# Patient Record
Sex: Male | Born: 1960 | Race: White | Hispanic: No | Smoking: Former smoker
Health system: Southern US, Community
[De-identification: ages and names within clinical notes are randomized; demographics above are authoritative.]

## PROBLEM LIST (undated history)

## (undated) DIAGNOSIS — J449 Chronic obstructive pulmonary disease, unspecified: Secondary | ICD-10-CM

## (undated) DIAGNOSIS — R569 Unspecified convulsions: Secondary | ICD-10-CM

## (undated) DIAGNOSIS — J439 Emphysema, unspecified: Secondary | ICD-10-CM

## (undated) HISTORY — DX: Unspecified convulsions: R56.9

## (undated) HISTORY — DX: Chronic obstructive pulmonary disease, unspecified: J44.9

## (undated) HISTORY — DX: Emphysema, unspecified: J43.9

---

## 1997-08-04 ENCOUNTER — Emergency Department (HOSPITAL_COMMUNITY): Admission: EM | Admit: 1997-08-04 | Discharge: 1997-08-04 | Payer: Self-pay | Admitting: Emergency Medicine

## 1997-08-20 ENCOUNTER — Encounter: Admission: RE | Admit: 1997-08-20 | Discharge: 1997-08-20 | Payer: Self-pay | Admitting: Family Medicine

## 2007-06-15 ENCOUNTER — Telehealth: Payer: Self-pay | Admitting: *Deleted

## 2007-06-21 ENCOUNTER — Emergency Department (HOSPITAL_COMMUNITY): Admission: EM | Admit: 2007-06-21 | Discharge: 2007-06-21 | Payer: Self-pay | Admitting: Family Medicine

## 2007-06-27 ENCOUNTER — Ambulatory Visit: Payer: Self-pay | Admitting: Family Medicine

## 2007-06-27 DIAGNOSIS — M4722 Other spondylosis with radiculopathy, cervical region: Secondary | ICD-10-CM | POA: Insufficient documentation

## 2007-06-28 ENCOUNTER — Encounter: Payer: Self-pay | Admitting: Family Medicine

## 2008-11-17 ENCOUNTER — Encounter (INDEPENDENT_AMBULATORY_CARE_PROVIDER_SITE_OTHER): Payer: Self-pay | Admitting: *Deleted

## 2008-11-17 DIAGNOSIS — F17200 Nicotine dependence, unspecified, uncomplicated: Secondary | ICD-10-CM | POA: Insufficient documentation

## 2008-11-17 DIAGNOSIS — Z87891 Personal history of nicotine dependence: Secondary | ICD-10-CM | POA: Insufficient documentation

## 2010-04-28 ENCOUNTER — Encounter: Payer: Self-pay | Admitting: *Deleted

## 2010-08-19 ENCOUNTER — Inpatient Hospital Stay (INDEPENDENT_AMBULATORY_CARE_PROVIDER_SITE_OTHER)
Admission: RE | Admit: 2010-08-19 | Discharge: 2010-08-19 | Disposition: A | Discharge status: Home / Self Care | Payer: Managed Care, Other (non HMO) | Source: Ambulatory Visit | Attending: Emergency Medicine | Admitting: Emergency Medicine

## 2010-08-19 ENCOUNTER — Ambulatory Visit (INDEPENDENT_AMBULATORY_CARE_PROVIDER_SITE_OTHER): Payer: Managed Care, Other (non HMO)

## 2010-08-19 DIAGNOSIS — F17200 Nicotine dependence, unspecified, uncomplicated: Secondary | ICD-10-CM

## 2010-08-19 DIAGNOSIS — J40 Bronchitis, not specified as acute or chronic: Secondary | ICD-10-CM

## 2010-09-12 ENCOUNTER — Encounter: Payer: Self-pay | Admitting: Family Medicine

## 2010-09-12 ENCOUNTER — Ambulatory Visit (INDEPENDENT_AMBULATORY_CARE_PROVIDER_SITE_OTHER): Payer: 59 | Admitting: Family Medicine

## 2010-09-12 VITALS — BP 123/82 | HR 74 | Temp 98.3°F | Ht 67.0 in | Wt 139.2 lb

## 2010-09-12 DIAGNOSIS — F17200 Nicotine dependence, unspecified, uncomplicated: Secondary | ICD-10-CM

## 2010-09-12 DIAGNOSIS — Z23 Encounter for immunization: Secondary | ICD-10-CM

## 2010-09-12 DIAGNOSIS — J449 Chronic obstructive pulmonary disease, unspecified: Secondary | ICD-10-CM

## 2010-09-12 MED ORDER — VARENICLINE TARTRATE 0.5 MG PO TABS: 0.5000 mg | ORAL_TABLET | Freq: Two times a day (BID) | ORAL | Status: DC

## 2010-09-12 MED ORDER — VARENICLINE TARTRATE 1 MG PO TABS: 1.0000 mg | ORAL_TABLET | Freq: Two times a day (BID) | ORAL | Status: AC

## 2010-09-12 MED ORDER — TETANUS-DIPHTH-ACELL PERTUSSIS 5-2.5-18.5 LF-MCG/0.5 IM SUSP: 0.5000 mL | Freq: Once | INTRAMUSCULAR | Status: DC

## 2010-09-12 NOTE — Assessment & Plan Note (Signed)
Wants to quit.  Has tried effexor and nicotine replacement in past.  Would like to try Chantix.

## 2010-09-12 NOTE — Progress Notes (Signed)
  Subjective:    Patient ID: Marcus Pennington, male    DOB: [birth year], 50 y.o.   MRN: [id number]  HPI Previously seen here but it has been several years. Still with neck problems, now affecting Lt side Still smokes. Recently seen in ER with "bronchitis" and told possible emphysema    Review of Systems     Objective:   Physical Exam HEENT nl   Neck without masses Lungs, clear Cardiac RRR without m Abd benign Neuro nl strength in upper ext.   Assessment & Plan:

## 2010-09-12 NOTE — Assessment & Plan Note (Signed)
Per recent cxr.  Will refer to pharm clinic for PFTs.  Likely needs controler.

## 2010-09-12 NOTE — Patient Instructions (Signed)
Your first prescription for chantix is already at the pharmacy. The printed prescription is for refills Good luck quitting smoking See Dr. Raymondo Band in 1-2 weeks to see how the quitting is qoing and to do lung volumes.

## 2010-09-26 ENCOUNTER — Ambulatory Visit (INDEPENDENT_AMBULATORY_CARE_PROVIDER_SITE_OTHER): Payer: 59 | Admitting: Pharmacist

## 2010-09-26 ENCOUNTER — Encounter: Payer: Self-pay | Admitting: Pharmacist

## 2010-09-26 VITALS — BP 127/72 | HR 50 | Ht 66.0 in | Wt 142.5 lb

## 2010-09-26 DIAGNOSIS — F17200 Nicotine dependence, unspecified, uncomplicated: Secondary | ICD-10-CM

## 2010-09-26 NOTE — Assessment & Plan Note (Addendum)
Plan to quit July 30. Wants to cut down to 5 cigarettes by July 23.   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    Initiated varenicline tx 12 days ago. Patient counseled on purpose, proper use, and potential adverse effects, including GI upset, and potential change in mood. F/U phone call on Monday, July 30 at [phone].  F/U Rx Clinic Visit on a Monday, August 13 at 1:30.   Total time with patient in face-to-face counseling 35 minutes.  Patient seen with Dr. Mikel Cella.

## 2010-09-26 NOTE — Progress Notes (Signed)
  Subjective:    Patient ID: Marcus Pennington, male    DOB: Nov 18, 1960, 50 y.o.   MRN: [id number]  HPI  Wants to quit because it is hard to breathe when hot outside  Age when started using tobacco on a daily basis 16. Number of Cigarettes per day 40. Brand smoked Marlboro. Estimated Nicotine Content per Cigarette (mg) 1.  Estimated Nicotine intake per day 40 mg.  With recent decrease, now intake 10mg  per day Smokes first cigarette 30 minutes after waking. Denies waking to smoke now / Was waking up to smoke 2-3 times per night.    Estimated Fagerstrom Score >6/10.  Most recent quit attempt  4-5 years ago Longest time ever been tobacco free 3 days. What Medications (NRT, bupropion, varenicline) used in past includes bupropion. (Stopped due to insomnia and cravings)  Rates IMPORTANCE of quitting tobacco on 1-10 scale of 10. ("Don't like that I can't breathe") Rates READINESS of quitting tobacco on 1-10 scale of 10. Rates CONFIDENCE of quitting tobacco on 1-10 scale of 8. (Wants to and "pretty sure I can") Triggers to use tobacco include; stress, after meals, waking up in morning, going to bed, get in the car Most difficult cigarette to give up is the first one in the morning Number of stress cigarettes smoked in a week unknown (family members fighting in the house is most stressful situation for him)   On day 12 of Chantix now. Makes him itch some on the left side of his neck. Down to 1/2 ppd from 2 packs. Cravings much less between cigarettes. Does not take with food, but does not have nausea. Able to afford Chantix with no out of pocket expense. Daughter also smokes at home, she smokes [employer] and he feels that she will be supportive of his cessation.   Goal date to quit is Monday, July 30th.     Review of Systems     Objective:   Physical Exam        Assessment & Plan:

## 2010-09-26 NOTE — Progress Notes (Signed)
  Subjective:    Patient ID: Marcus Pennington, male    DOB: 1960-06-01, 50 y.o.   MRN: [id number]  HPI Reviewed and agree with Dr. Macky Lower management.    Review of Systems     Objective:   Physical Exam        Assessment & Plan:

## 2010-09-26 NOTE — Patient Instructions (Addendum)
It was nice to meet you today!  You are doing well on Chantix. Continue the doses Dr. Leveda Anna gave you with a quit date of July 30.   You can use hydrocortisone cream on the itch, but if it gets worse please let us know. If the itching persists after you quit, we may consider lowering your dose.  I will call on Monday, July 30 to follow-up with you. Please come back to see me in August for a brief visit to see how well you are doing and we will consider doing a lung test at that time.

## 2010-10-06 ENCOUNTER — Telehealth: Payer: Self-pay | Admitting: Pharmacist

## 2010-10-06 DIAGNOSIS — F17200 Nicotine dependence, unspecified, uncomplicated: Secondary | ICD-10-CM

## 2010-10-06 NOTE — Assessment & Plan Note (Signed)
Patient contacted. He denies smoking today!   He did well with tapering down over the last week or so.  He is trying to keep himself busy.   Encouraged continued abstinence.  Encouraged to keep appt in two weeks.    Itching on neck has remained the same.  Patient willing to continue with BID chantix.  Denies any other side effects.   Mood sounded like he was positive and determined.

## 2010-10-06 NOTE — Telephone Encounter (Signed)
Patient contacted. He denies smoking today!   He did well with tapering down over the last week or so.  He is trying to keep himself busy.   Encouraged continued abstinence.  Encouraged to keep appt in two weeks.    Itching on neck has remained the same.

## 2010-10-20 ENCOUNTER — Ambulatory Visit (INDEPENDENT_AMBULATORY_CARE_PROVIDER_SITE_OTHER): Payer: 59 | Admitting: Pharmacist

## 2010-10-20 ENCOUNTER — Encounter: Payer: Self-pay | Admitting: Pharmacist

## 2010-10-20 VITALS — BP 118/72 | HR 74 | Ht 67.0 in | Wt 143.0 lb

## 2010-10-20 DIAGNOSIS — F17200 Nicotine dependence, unspecified, uncomplicated: Secondary | ICD-10-CM

## 2010-10-20 NOTE — Progress Notes (Signed)
  Subjective:    Patient ID: Marcus Pennington, male    DOB: [birth year], 50 y.o.   MRN: [id number]  HPI Marcus Pennington returned to clinic today in cheerful mood.  He reports having been tobacco-free for two weeks, except for two cigarettes after a family argument.  He is maintained on chantix without significant problems, except for a ringworm-like rash on his neck.  He reports continued, persistent cough and mucus production.  Family stress remains an issue and potential barrier for long-term success, but he is aware of the impact and attempting coping strategies.   Review of Systems As per HPI.    Objective:   Physical Exam  Filed Vitals:   10/20/10 1651  BP: 118/72  Pulse: 74      Assessment & Plan:  Moderate Nicotine Dependence of 33 years duration in a patient who is good candidate for success b/c of motivation and understanding of potential health risks/benefits.    Initiated varenicline tx 2 weeks ago. Patient counseled on purpose, proper use, and potential adverse effects, including GI upset, and potential change in mood.   Written information provided. Provided information on 1 800-QUIT NOW support program.  Prescription written for clotrimazole 1% (30gm) for rash.  F/U Rx Clinic Visit in one month.   Total time with patient in face-to-face counseling 15 minutes.  Patient seen with Audry Riles, PharmD Candidate, Albertine Grates, Pharmacy Resident, and Edd Arbour, MD [facility] Practice Resident. Marland Kitchen

## 2010-10-20 NOTE — Assessment & Plan Note (Signed)
Moderate Nicotine Dependence of 33 years duration in a patient who is good candidate for success b/c of motivation and understanding of potential health risks/benefits.    Initiated varenicline tx 2 weeks ago. Patient counseled on purpose, proper use, and potential adverse effects, including GI upset, and potential change in mood.   Written information provided. Provided information on 1 800-QUIT NOW support program.  F/U Rx Clinic Visit in one month.   Total time with patient in face-to-face counseling 15 minutes.  Patient seen with Audry Riles, PharmD Candidate, Albertine Grates, Pharmacy Resident, and Edd Arbour, MD [facility] Practice Resident. Marland Kitchen

## 2010-10-21 NOTE — Progress Notes (Signed)
  Subjective:    Patient ID: Marcus Pennington, male    DOB: 06/14/1960, 49 y.o.   MRN: [id number]  HPI Reviewed and agree with Dr. Koval's management.    Review of Systems     Objective:   Physical Exam        Assessment & Plan:   

## 2010-11-17 ENCOUNTER — Encounter: Payer: Self-pay | Admitting: Pharmacist

## 2010-11-17 ENCOUNTER — Ambulatory Visit (INDEPENDENT_AMBULATORY_CARE_PROVIDER_SITE_OTHER): Payer: 59 | Admitting: Pharmacist

## 2010-11-17 VITALS — BP 142/80 | HR 74 | Wt 147.6 lb

## 2010-11-17 DIAGNOSIS — F17200 Nicotine dependence, unspecified, uncomplicated: Secondary | ICD-10-CM

## 2010-11-17 NOTE — Assessment & Plan Note (Signed)
Moderate Nicotine Dependence of 33 years duration in a patient who is excellent candidate for success b/c of motivation and current progress/success on varenicline.     Initiated varenicline approx. 6 weeks ago. Patient counseled on chantix taper and possible withdrawal potential as dose decreases. Written information about taper provided. F/U phone call in late October. Total time with patient in face-to-face counseling 15 minutes.  Patient seen with Marcus Pennington, PharmD Candidate, Marcus Pennington, Pharmacy Resident, Marcus Bidding, DO, and Marcus Conch, MD.

## 2010-11-17 NOTE — Progress Notes (Signed)
  Subjective:    Patient ID: Marcus Pennington, male    DOB: 06/21/1960, 50 y.o.   MRN: [id number]  HPI  Patient presented to clinic in good spirits. Reports being completely tocacco-free since last visit. Continues to have ringworm-like rash on neck, but overall no complaints on Chantix.   Review of Systems     Objective:   Physical Exam        Assessment & Plan:  Moderate Nicotine Dependence of 33 years duration in a patient who is excellent candidate for success b/c of motivation and current progress/success on varenicline.     Initiated varenicline approx. 6 weeks ago. Patient counseled on chantix taper and possible withdrawal potential as dose decreases. Written information about taper provided. F/U phone call in late October. Total time with patient in face-to-face counseling 15 minutes.  Patient seen with Tomi Bamberger, PharmD Candidate, Benjaman Pott, Pharmacy Resident, Gaspar Bidding, DO, and Tana Conch, MD.

## 2010-11-17 NOTE — Progress Notes (Signed)
  Subjective:    Patient ID: Marcus Pennington, male    DOB: 04/07/1960, 49 y.o.   MRN: [id number]  HPI Reviewed and agree with Dr. Koval's management.    Review of Systems     Objective:   Physical Exam        Assessment & Plan:   

## 2015-11-30 ENCOUNTER — Ambulatory Visit (HOSPITAL_COMMUNITY)
Admission: EM | Admit: 2015-11-30 | Discharge: 2015-11-30 | Disposition: A | Discharge status: Home / Self Care | Payer: BLUE CROSS/BLUE SHIELD | Attending: Internal Medicine | Admitting: Internal Medicine

## 2015-11-30 ENCOUNTER — Encounter (HOSPITAL_COMMUNITY): Payer: Self-pay | Admitting: Emergency Medicine

## 2015-11-30 DIAGNOSIS — M7541 Impingement syndrome of right shoulder: Secondary | ICD-10-CM

## 2015-11-30 DIAGNOSIS — M5412 Radiculopathy, cervical region: Secondary | ICD-10-CM | POA: Diagnosis not present

## 2015-11-30 MED ORDER — PREDNISONE 50 MG PO TABS: 50.0000 mg | ORAL_TABLET | Freq: Every day | ORAL | 0 refills | Status: DC

## 2015-11-30 MED ORDER — CYCLOBENZAPRINE HCL 10 MG PO TABS: 10.0000 mg | ORAL_TABLET | Freq: Two times a day (BID) | ORAL | 0 refills | Status: DC | PRN

## 2015-11-30 MED ORDER — NAPROXEN 500 MG PO TABS: 500.0000 mg | ORAL_TABLET | Freq: Two times a day (BID) | ORAL | 0 refills | Status: DC

## 2015-11-30 MED ORDER — HYDROCODONE-ACETAMINOPHEN 5-325 MG PO TABS: 2.0000 | ORAL_TABLET | ORAL | 0 refills | Status: DC | PRN

## 2015-11-30 NOTE — ED Triage Notes (Addendum)
Patient reports onset of symptoms was Monday.  Patient has pain in right neck, shoulder and arm.  Right little finger and right ring finger are numb.  Patient is right handed.  Patient bends pipe for a living.  Patient denies any injury

## 2015-11-30 NOTE — Discharge Instructions (Addendum)
Followup with Dr Andria Frames or orthopedist to discuss further evaluation and management of right neck/shoulder pain, if not improving in the next few days.  Note for work, RTW 12/05/15 given.

## 2015-11-30 NOTE — ED Provider Notes (Signed)
[place] [place]    CSN: [id number] Arrival date & time: 11/30/15  1509  First Provider Contact:  First MD Initiated Contact with Patient 11/30/15 1735        History   Chief Complaint Chief Complaint  Patient presents with  . Shoulder Pain    HPI Marcus Pennington is a 55 y.o. male. He presents today with a one-week history of pain in the right neck and shoulder, had something similar several years ago. This improved after 6 weeks with a soft neck brace, some muscle relaxers, and anti-inflammatories. The current episode is strongly reminiscent of the previous episode, but less severe at this time. Patient has tingling down the right arm into the fourth and fifth fingers, pain in the right shoulder and in the trapezius area. The symptoms are magnified by elevating the arm at the shoulder, and by turning the head. 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 No fever, no cough, no dyspnea. No chest discomfort.    HPI  History reviewed. No pertinent past medical history.  Patient Active Problem List   Diagnosis Date Noted  . COPD (chronic obstructive pulmonary disease) ([place]) 09/12/2010  . TOBACCO USER 11/17/2008  . NECK PAIN 06/27/2007    History reviewed. No pertinent surgical history.     Home Medications        Not taking any meds regularly   Family History Family History  Problem Relation Age of Onset  . Depression Mother   . Arthritis Mother   . Hypertension Mother   . Cancer Mother   . Diabetes Mother     Social History Social History  Substance Use Topics  . Smoking status: Former Smoker    Packs/day: 1.00    Years: 33.00    Types: Cigarettes  . Smokeless tobacco: Never Used  . Alcohol use No     Allergies   Penicillins and Sulfa antibiotics   Review of Systems Review of Systems  All other systems reviewed and are  negative.    Physical Exam Triage Vital Signs ED Triage Vitals  Enc Vitals Group     BP 11/30/15 1620 155/88     Pulse Rate 11/30/15 1620 63     Resp 11/30/15 1620 16     Temp 11/30/15 1620 98.2 F (36.8 C)     Temp Source 11/30/15 1620 Oral     SpO2 11/30/15 1620 100 %     Weight --      Height --      Pain Score 11/30/15 1638 8   Updated Vital Signs BP 155/88 (BP Location: Left Arm)   Pulse 63   Temp 98.2 F (36.8 C) (Oral)   Resp 16   SpO2 100%  Physical Exam  Constitutional: He is oriented to person, place, and time. He appears well-nourished.  Alert, nicely groomed Mild distress, holding right shoulder  HENT:  Head: Atraumatic.  Eyes:  Conjugate gaze, no eye redness/drainage  Neck: Neck supple.  Cardiovascular: Normal rate and regular rhythm.   Pulmonary/Chest: No respiratory distress.  Lungs clear, symmetric breath sounds  Abdominal: Soft. He exhibits no distension. There is no tenderness. There is no guarding.  Musculoskeletal: Normal range of motion.  Tender over the right acromion Able to extend right arm at the shoulder overhead, but painful, with limited external rotation. Pain down the arm is magnified by rotation of  the neck, as well as by neck extension.  Neurological: He is alert and oriented to person, place, and time.  Skin: Skin is warm and dry.  No cyanosis  Nursing note and vitals reviewed.    UC Treatments / Results   Procedures Procedures (including critical care time)      None today  Final Clinical Impressions(s) / UC Diagnoses   Final diagnoses:  Right cervical radiculopathy  Rotator cuff impingement syndrome of right shoulder   Followup with Dr Andria Frames or orthopedist to discuss further evaluation and management of right neck/shoulder pain, if not improving in the next few days.  Note for work, RTW 12/05/15 given.   New Prescriptions Discharge Medication List as of 11/30/2015  5:42 PM    START taking these medications   Details   cyclobenzaprine (FLEXERIL) 10 MG tablet Take 1 tablet (10 mg total) by mouth 2 (two) times daily as needed for muscle spasms., Starting Sat 11/30/2015, Normal    HYDROcodone-acetaminophen (NORCO/VICODIN) 5-325 MG tablet Take 2 tablets by mouth every 4 (four) hours as needed., Starting Sat 11/30/2015, Print    naproxen (NAPROSYN) 500 MG tablet Take 1 tablet (500 mg total) by mouth 2 (two) times daily., Starting Sat 11/30/2015, Normal    predniSONE (DELTASONE) 50 MG tablet Take 1 tablet (50 mg total) by mouth daily., Starting Sat 11/30/2015, Normal         Sherlene Shams, MD 12/02/15 1123

## 2015-12-11 ENCOUNTER — Ambulatory Visit (INDEPENDENT_AMBULATORY_CARE_PROVIDER_SITE_OTHER): Payer: BLUE CROSS/BLUE SHIELD | Admitting: Family Medicine

## 2015-12-11 DIAGNOSIS — Z23 Encounter for immunization: Secondary | ICD-10-CM

## 2015-12-11 DIAGNOSIS — M4722 Other spondylosis with radiculopathy, cervical region: Secondary | ICD-10-CM | POA: Diagnosis not present

## 2015-12-11 MED ORDER — NAPROXEN 500 MG PO TABS: 500.0000 mg | ORAL_TABLET | Freq: Two times a day (BID) | ORAL | 3 refills | Status: DC

## 2015-12-11 MED ORDER — CYCLOBENZAPRINE HCL 10 MG PO TABS: 10.0000 mg | ORAL_TABLET | Freq: Two times a day (BID) | ORAL | 2 refills | Status: DC | PRN

## 2015-12-11 MED ORDER — GABAPENTIN 100 MG PO CAPS
100.0000 mg | ORAL_CAPSULE | Freq: Three times a day (TID) | ORAL | 3 refills | Status: DC
Start: 2015-12-11 — End: 2018-05-11

## 2015-12-11 NOTE — Patient Instructions (Signed)
I am convinced by your story and my exam that you have a pinched nerve in your neck.   Generally we give a total of 6 weeks to see if it improves on its own.  One more month for you. If not improving, I will do an MRI of your neck to confirm the diagnosis.   If the weakness is getting worse, I will order the MRI sooner.   I sent in refills of two medicines plus a third nerve pain medication.   See me in one month, sooner if worsening.

## 2015-12-11 NOTE — Assessment & Plan Note (Addendum)
Will try conservative therapy.  Will proceed with MRI in four weeks if not improving.  Major concern is weakness.  MRI sooner if progressive symptoms.

## 2015-12-12 ENCOUNTER — Encounter: Payer: Self-pay | Admitting: Family Medicine

## 2015-12-12 NOTE — Progress Notes (Signed)
   Subjective:    Patient ID: Marcus Pennington, male    DOB: [birth year], 55 y.o.   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  CC is right arm pain and numbness.  Also some weakness.  Feels the problem starts in his neck.  Specifically, doubts primary shoulder problem with good motion in shoulder.  Hx of presumed cervical radiculopathy previously with x ray changes at the c6-7 level. States 4th and fifth digits feel numb, but not fully numb.  Cannot do his normal work with the pain.  Present x 2 weeks.  Only minimal neck pain    Review of Systems     Objective:   Physical Exam Good ROM of right shoulder, elbow and wrist joint.  Seems to have mild bicept and mod tricept weakness.  Pain/numbness distribution is post medial arm.  DTRs diminished but present.       Assessment & Plan:

## 2015-12-16 ENCOUNTER — Telehealth: Payer: Self-pay | Admitting: Family Medicine

## 2015-12-16 DIAGNOSIS — M4722 Other spondylosis with radiculopathy, cervical region: Secondary | ICD-10-CM

## 2015-12-16 MED ORDER — NAPROXEN 500 MG PO TABS: 500.0000 mg | ORAL_TABLET | Freq: Two times a day (BID) | ORAL | 3 refills | Status: DC

## 2015-12-16 NOTE — Telephone Encounter (Signed)
Need refill on naproxen.  [facility] in [place]

## 2018-04-28 ENCOUNTER — Telehealth: Payer: Self-pay | Admitting: Family Medicine

## 2018-04-28 DIAGNOSIS — R569 Unspecified convulsions: Secondary | ICD-10-CM

## 2018-04-28 HISTORY — DX: Unspecified convulsions: R56.9

## 2018-04-28 NOTE — Telephone Encounter (Signed)
**  After Hours/ Emergency Line Call**  Received a call to report that Marcus Pennington by daughter.  Endorsing that patient had new onset seizure and was seen by [facility] today. Just got back home from ED.  Called by his daughter. Saw in [facility] hospital. Didn't feel well and laid down. Felt LH and felt like he was going to pass out. He froze. Fiance came in and saw that patient's whole body is tense and had seizure. No seizure history. Was told at [facility] and was discharged because he had no cause. Tested blood and urine and didn't find anything. Patient is not concerned as he states he received full workup by ED and was told by ED physician that he would just need follow up with his PCP. Denying any current symptoms and states he is back at baseline. Unfortunately I am unable to see any records from this visit and unable to see labs. Advised that if patient/daughter is concerned he should come to [facility] and be see and evaluated by a physician. Patient refused as he said he is back at baseline and had just received full work up this evening by ED in [facility] and already saw a doctor.  Recommended that patient be seen in clinic tomorrow. Due to weather concerns made an afternoon appointment in case snow closes clinic in AM. Informed patient that if he is symptomatic at all tonight he needs to call 9-1-1 or come to ED immediately. Also advised that he make sure to be seen by a physician by tomorrow at the latest. Very strict return precautions given. Red flags discussed.  Will forward to PCP.   Caroline More, DO PGY-2, [facility] 04/28/2018 10:36 PM

## 2018-04-29 ENCOUNTER — Ambulatory Visit (INDEPENDENT_AMBULATORY_CARE_PROVIDER_SITE_OTHER): Payer: BLUE CROSS/BLUE SHIELD | Admitting: Family Medicine

## 2018-04-29 ENCOUNTER — Other Ambulatory Visit: Payer: Self-pay

## 2018-04-29 ENCOUNTER — Ambulatory Visit (INDEPENDENT_AMBULATORY_CARE_PROVIDER_SITE_OTHER): Payer: BLUE CROSS/BLUE SHIELD | Admitting: Licensed Clinical Social Worker

## 2018-04-29 VITALS — BP 102/58 | HR 81 | Temp 98.1°F | Wt 121.2 lb

## 2018-04-29 DIAGNOSIS — G44209 Tension-type headache, unspecified, not intractable: Secondary | ICD-10-CM

## 2018-04-29 DIAGNOSIS — R569 Unspecified convulsions: Secondary | ICD-10-CM | POA: Diagnosis not present

## 2018-04-29 DIAGNOSIS — Z789 Other specified health status: Secondary | ICD-10-CM

## 2018-04-29 DIAGNOSIS — K625 Hemorrhage of anus and rectum: Secondary | ICD-10-CM

## 2018-04-29 DIAGNOSIS — R0602 Shortness of breath: Secondary | ICD-10-CM | POA: Diagnosis not present

## 2018-04-29 DIAGNOSIS — R634 Abnormal weight loss: Secondary | ICD-10-CM | POA: Diagnosis not present

## 2018-04-29 DIAGNOSIS — J449 Chronic obstructive pulmonary disease, unspecified: Secondary | ICD-10-CM

## 2018-04-29 MED ORDER — TIOTROPIUM BROMIDE MONOHYDRATE 18 MCG IN CAPS: 18.0000 ug | ORAL_CAPSULE | Freq: Every day | RESPIRATORY_TRACT | 12 refills | Status: DC

## 2018-04-29 MED ORDER — ALBUTEROL SULFATE HFA 108 (90 BASE) MCG/ACT IN AERS: 2.0000 | INHALATION_SPRAY | Freq: Four times a day (QID) | RESPIRATORY_TRACT | 11 refills | Status: DC | PRN

## 2018-04-29 MED ORDER — ACETAMINOPHEN 500 MG PO TABS
1000.0000 mg | ORAL_TABLET | Freq: Once | ORAL | Status: AC
  Administered 2018-04-29: 1000 mg via ORAL

## 2018-04-29 NOTE — BH Specialist Note (Signed)
Integrated Behavioral Health Initial Visit  MRN: [id number] Name: [name] [facility]  Session Start time: 3:00 Session End time: 3:15 Total time: 15 minutes  Type of Service: Integrated Behavioral Health   Warm Hand Off Completed.      Patient and/or legal guardian verbally consented to meet with [facility] about presenting concerns.  SUBJECTIVE: Marcus Pennington is a 58 y.o. male referred by Dr. Enid Derry for transportation needs after he was instructed he is unable to drive due to his seizure on 04/29/2018. Patient was discouraged by this news but reported that his daughter and her boyfriend would be able to assist with his transportation needs.   Report of symptoms: feeling very weak but worse since his stroke.  LIFE CONTEXT: Family and Social: Patient lives with his daughter Claudie Fisherman and her boyfriend. 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  INTERVENTION:*  Solution-Focused Strategies and Supportive Counseling,    Audry Riles, [place] intern [place],  [place] [phone]

## 2018-04-29 NOTE — Progress Notes (Signed)
Subjective:    Patient ID: Marcus Pennington, male    DOB: [birth year], 58 y.o.   MRN: [id number]  58 year old male with PMH significant for COPD (untreated), tobacco use disorder, and limited access to medical care.  CC: Follow-up from ED for possible seizure  HPI:  Seizure activity Patient states that he is here after having what he believes to be a seizure yesterday. He presented to the ED and they ran tests and told him to follow up with [place] as well as neurology. Patient is here with his daughter and daughter's fiance. He has never had any seizures before.   Reports he started to suddenly feel weak and dizzy, like he might fall. He went to lie down, and called his family. Made it to the bed, but then had about 15 minutes where he was "in and out" - difficult to wake up, they had to shake him and yell in his ear to get him to respond, then quickly fell back out, but able to get him to respond a little with continued stimulation.  Also reports that he was shaking for less than a minute in front of family which they believed to be seizure activity. Patient states that he was very tired and is still fatigued today.   Patient states that he was not told to avoid driving. He has no further episodes like the one that sent hm to the ED since yesterday.   Cough Patient reports that for the past 3-4 months he has had productive cough with shortness of breath. He does not like coming into the doctor. He has h/o COPD but has no inhalers at home. He works 12 hour shifts multiple days per week. States that since 58 yo he smoked 3 ppd until 2-3 years ago when he started smoking only 1 ppd (>90 year pack history). Patient denies any recent illness, fevers or chills. He does report a 20-30 pound unintentional weight loss over the past few months.   Bright red blood per rectum Patient reports that for the past 3-4 months he has had bright red blood per rectum.  States it is not every time that he goes to the  bathroom.  States that he has never had a colonoscopy.  Patient reports that he sometimes has pain with his bowel movements.  Notices that he is having blood in the toilet.  Patient reports that his grandma has a history of colon cancer.  Mom had diverticulitis.  Daughter has IBS.  Patient reports that he has been having loose stools for these 3 to 4 months as well however states that it is not quite diarrhea.  Patient reports that he has been fatigued and is having weight loss as discussed above.   He does report a 20-30 pound unintentional weight loss over the past few months.   Smoking status reviewed-   ROS: 10 point ROS is otherwise negative, except as mentioned in HPI  Patient Active Problem List   Diagnosis Date Noted  . BRBPR (bright red blood per rectum) 05/04/2018  . Unintentional weight loss 04/30/2018  . Shortness of breath 04/30/2018  . Seizures ([place]) 04/30/2018  . COPD (chronic obstructive pulmonary disease) ([place]) 09/12/2010  . TOBACCO USER 11/17/2008  . Cervical radiculopathy due to degenerative joint disease of spine 06/27/2007     Objective:  BP (!) 102/58   Pulse 81   Temp 98.1 F (36.7 C) (Oral)   Wt 121 lb 4 oz (55 kg)   SpO2 94%  BMI 18.99 kg/m  Vitals and nursing note reviewed O2 97% with ambulatory pulse ox  General: NAD, pleasant HEENT: PEELA, EOMI Neck: supple Cardiac: RRR, normal heart sounds, no murmurs Respiratory: Coarse breath sounds throughout with right being worse than left.  No wheezing noted.  No crackles noted, normal effort room air Abdomen: soft, nontender, nondistended Extremities: no edema or cyanosis. WWP. Skin: warm and dry, no rashes noted Neuro: alert and oriented, no focal deficits Psych: normal affect  Assessment & Plan:    Unintentional weight loss Will obtain HIV and hepatitis C screening here today in office.  We will also obtain proper imaging to rule out cancers as possible cause including CT of chest and  colonoscopy.  Unintentional weight loss could be secondary to poorly controlled COPD given patient's greater than 90-pack-year smoke history and physical exam.  Patient also with history of BRBPR which could be related to IBS and could be causing patient to have weight loss.  However must rule out other etiologies such as cancer.  Shortness of breath Patient reporting shortness of breath as well as chronic cough.  Will obtain CT of chest with and without contrast in order to determine other etiologies of shortness of breath.  CXR in ED with findings C/W emphysema.  When ambulating in hall with pulse ox patient desatted only to 97%. We will also start patient on Spiriva as well as albuterol as needed for wheezing and shortness of breath. Patient will also need to schedule PFTs however this was not done at this appointment given the magnitude of issues that were discussed.  Will need to schedule this at a later time.  COPD (chronic obstructive pulmonary disease) See plan for shortness of breath.  BRBPR (bright red blood per rectum) Patient reports that grandma has history of colon cancer, mom with history of diverticulitis and daughter with history of IBS.  Given the unintentional weight loss and history of smoking will obtain screening colonoscopy.  Unable to perform rectal exam given time constraints during office visit.  Patient may benefit from this in office if he is unable to be scheduled for colonoscopy soon.  Differential diagnosis includes hemorrhoids vs anal fissure vs diverticulosis vs IBS vs colon cancer.  Seizures [facility]) Patient with possible seizure activity that sent him to the emergency room.  Labs done at that time were all grossly normal.  Patient does report that he was very fatigued after this "episode".  Unclear if this was true seizures however patient instructed not to drive until he can be further evaluated by neurology.   - Patient could have had a syncopal episode due to  worsening and untreated COPD. -Performing work-up for other etiologies such as metastasis  [place] Milda Lindvall, DO Family Medicine Resident PGY-2

## 2018-04-29 NOTE — Patient Instructions (Signed)
Thank you for coming to see me today. It was a pleasure! Today we talked about:   I have ordered a CT of your chest in order to determine what is going on with your cough.  I have also prescribed an albuterol inhaler that you try to use as needed for shortness of breath or wheezing.  There is an inhaler called Spiriva that have also ordered for you to use daily in order to help with your cough.  We have placed a referral to a gastroenterologist in order for you to have a colonoscopy done.  We have obtained some blood work today.  I will call you with those lab results.  Given that you had a seizure yesterday it is against [place] for you to drive a motorized vehicle.  I will ensure that you have follow-up with neurology.  If you noticed the chest pain that you have on exertion getting worse or not resolving after rest then please go to the emergency room immediately.  Please follow-up with Dr. Andria Frames in 1 week or sooner as needed.  If you have any questions or concerns, please do not hesitate to call the office at [phone].  Take Care,   Martinique Wilburn Keir, DO

## 2018-04-30 DIAGNOSIS — R634 Abnormal weight loss: Secondary | ICD-10-CM | POA: Insufficient documentation

## 2018-04-30 DIAGNOSIS — R569 Unspecified convulsions: Secondary | ICD-10-CM | POA: Insufficient documentation

## 2018-04-30 DIAGNOSIS — R0602 Shortness of breath: Secondary | ICD-10-CM | POA: Insufficient documentation

## 2018-04-30 LAB — CBC WITH DIFFERENTIAL/PLATELET
BASOS: 0 %
Basophils Absolute: 0 10*3/uL (ref 0.0–0.2)
EOS (ABSOLUTE): 0.2 10*3/uL (ref 0.0–0.4)
EOS: 2 %
HEMATOCRIT: 45.1 % (ref 37.5–51.0)
Hemoglobin: 16 g/dL (ref 13.0–17.7)
IMMATURE GRANS (ABS): 0 10*3/uL (ref 0.0–0.1)
IMMATURE GRANULOCYTES: 0 %
Lymphocytes Absolute: 2.8 10*3/uL (ref 0.7–3.1)
Lymphs: 26 %
MCH: 34 pg — AB (ref 26.6–33.0)
MCHC: 35.5 g/dL (ref 31.5–35.7)
MCV: 96 fL (ref 79–97)
MONOS ABS: 1 10*3/uL — AB (ref 0.1–0.9)
Monocytes: 9 %
NEUTROS ABS: 6.7 10*3/uL (ref 1.4–7.0)
Neutrophils: 63 %
PLATELETS: 328 10*3/uL (ref 150–450)
RBC: 4.71 x10E6/uL (ref 4.14–5.80)
RDW: 13.7 % (ref 11.6–15.4)
WBC: 10.8 10*3/uL (ref 3.4–10.8)

## 2018-04-30 LAB — HEPATITIS C ANTIBODY: Hep C Virus Ab: <0.1 s/co ratio (ref 0.0–0.9)

## 2018-04-30 LAB — HIV ANTIBODY (ROUTINE TESTING W REFLEX): HIV SCREEN 4TH GENERATION: NONREACTIVE

## 2018-04-30 NOTE — Assessment & Plan Note (Addendum)
Will obtain HIV and hepatitis C screening here today in office.  We will also obtain proper imaging to rule out cancers as possible cause including CT of chest and colonoscopy.  Unintentional weight loss could be secondary to poorly controlled COPD given patient's greater than 90-pack-year smoke history and physical exam.  Patient also with history of BRBPR which could be related to IBS and could be causing patient to have weight loss.  However must rule out other etiologies such as cancer.

## 2018-04-30 NOTE — Assessment & Plan Note (Signed)
Patient reporting shortness of breath as well as chronic cough.  Will obtain CT of chest with and without contrast in order to determine other etiologies of shortness of breath.  CXR in ED with findings C/W emphysema.  When ambulating in hall with pulse ox patient desatted only to 97%. We will also start patient on Spiriva as well as albuterol as needed for wheezing and shortness of breath. Patient will also need to schedule PFTs however this was not done at this appointment given the magnitude of issues that were discussed.  Will need to schedule this at a later time.

## 2018-04-30 NOTE — Assessment & Plan Note (Signed)
See plan for shortness of breath.

## 2018-05-02 ENCOUNTER — Encounter: Payer: Self-pay | Admitting: Family Medicine

## 2018-05-02 ENCOUNTER — Other Ambulatory Visit: Payer: Self-pay

## 2018-05-04 DIAGNOSIS — K625 Hemorrhage of anus and rectum: Secondary | ICD-10-CM | POA: Insufficient documentation

## 2018-05-04 NOTE — Assessment & Plan Note (Signed)
Patient reports that grandma has history of colon cancer, mom with history of diverticulitis and daughter with history of IBS.  Given the unintentional weight loss and history of smoking will obtain screening colonoscopy.  Unable to perform rectal exam given time constraints during office visit.  Patient may benefit from this in office if he is unable to be scheduled for colonoscopy soon.  Differential diagnosis includes hemorrhoids vs anal fissure vs diverticulosis vs IBS vs colon cancer.

## 2018-05-04 NOTE — Assessment & Plan Note (Addendum)
Patient with possible seizure activity that sent him to the emergency room.  Labs done at that time were all grossly normal.  Patient does report that he was very fatigued after this "episode".  Unclear if this was true seizures however patient instructed not to drive until he can be further evaluated by neurology.   - Patient could have had a syncopal episode due to worsening and untreated COPD. -Performing work-up for other etiologies such as metastasis

## 2018-05-06 ENCOUNTER — Encounter: Payer: Self-pay | Admitting: Family Medicine

## 2018-05-06 ENCOUNTER — Ambulatory Visit
Admission: RE | Admit: 2018-05-06 | Discharge: 2018-05-06 | Disposition: A | Discharge status: Home / Self Care | Payer: BLUE CROSS/BLUE SHIELD | Source: Ambulatory Visit | Attending: Family Medicine | Admitting: Family Medicine

## 2018-05-06 DIAGNOSIS — R0602 Shortness of breath: Secondary | ICD-10-CM

## 2018-05-06 MED ORDER — IOPAMIDOL (ISOVUE-300) INJECTION 61%
75.0000 mL | Freq: Once | INTRAVENOUS | Status: AC | PRN
  Administered 2018-05-06: 75 mL via INTRAVENOUS

## 2018-05-09 ENCOUNTER — Ambulatory Visit (INDEPENDENT_AMBULATORY_CARE_PROVIDER_SITE_OTHER): Payer: BLUE CROSS/BLUE SHIELD | Admitting: Family Medicine

## 2018-05-09 ENCOUNTER — Other Ambulatory Visit: Payer: Self-pay

## 2018-05-09 ENCOUNTER — Encounter: Payer: Self-pay | Admitting: Family Medicine

## 2018-05-09 DIAGNOSIS — J449 Chronic obstructive pulmonary disease, unspecified: Secondary | ICD-10-CM

## 2018-05-09 DIAGNOSIS — R569 Unspecified convulsions: Secondary | ICD-10-CM | POA: Diagnosis not present

## 2018-05-09 DIAGNOSIS — R634 Abnormal weight loss: Secondary | ICD-10-CM | POA: Diagnosis not present

## 2018-05-09 NOTE — Assessment & Plan Note (Signed)
Seems well control.  CT of chest is reassuring that we are not dealing with a malignant pulmonary process.

## 2018-05-09 NOTE — Assessment & Plan Note (Signed)
He may be right that the weight loss is from work and job stress.  Definitely needs colonoscopy and neuro referral.  Will complete WU with CMP and TSH.  I will also fill out FMLA paperwork.  First, he needs to keep his physicians appointments.  Second, given his age and COPD, it is bad for his health to be working 60+ hour weeks.

## 2018-05-09 NOTE — Patient Instructions (Signed)
You should not work until seen by the neurologist. I will call with the blood test results. Eat three meals per day, not one. I will fill out the FMLA paperwork and fax to your employer.  I will talk about the acute problem of maybe a seizure and also the chronic problem of wt loss.

## 2018-05-09 NOTE — Progress Notes (Signed)
Established Patient Office Visit  Subjective:  Patient ID: Marcus Pennington, male    DOB: 1960-05-27  Age: 58 y.o. MRN: [id number]  CC:  Chief Complaint  Patient presents with  . Follow-up    HPI TRAMAYNE SEBESTA presents for possible seizure and much more.  Convoluted story. With a chronic and acute component. Chronic problem began about one year ago when he last felt well.  Patient has been tired and weak progressively over the past one year.  Focal symtoms are cough with known COPD, weight loss- he was unaware of weight loss until recently seen, and some BRBPR = rectal bleeding.  Documented 15+ lb wt loss.  Having less cough and less SOB on spiriva.  Never had colon cancer screen.   Seen last week and had GI referrral placed and a chest CT ordered and done (no neoplasm.)  He attributes much of his problems to work and family stress.  Works in Psych[employer], educational with required overtime.  He has been putting in 10-12 hour days 6 days per week.  At home, he has dependant children and grandchildren living with him.  He is the sole breadwinner.  Acute problem was syncope/seizure like activity.  When his weight was at its lowest and he had an acute illness, was at home, felt nauseated, went to bed and passed out with some jerking activity.  Taken to ER, no neuro imaging done.  Told possible seizure and to make appointment with neurologist.  Told not to drive or work.  Has not made neurology appointment yet.  Denies headaches or fever.  History reviewed. No pertinent past medical history.  History reviewed. No pertinent surgical history.  Family History  Problem Relation Age of Onset  . Depression Mother   . Arthritis Mother   . Hypertension Mother   . Cancer Mother   . Diabetes Mother     Social History   Socioeconomic History  . Marital status: Divorced    Spouse name: Not on file  . Number of children: Not on file  . Years of education: Not on file  . Highest education level: Not on  file  Occupational History  . Not on file  Social Needs  . Financial resource strain: Not on file  . Food insecurity:    Worry: Not on file    Inability: Not on file  . Transportation needs:    Medical: Not on file    Non-medical: Not on file  Tobacco Use  . Smoking status: Former Smoker    Packs/day: 1.00    Years: 33.00    Pack years: 33.00    Types: Cigarettes  . Smokeless tobacco: Never Used  Substance and Sexual Activity  . Alcohol use: No  . Drug use: No  . Sexual activity: Not Currently  Lifestyle  . Physical activity:    Days per week: Not on file    Minutes per session: Not on file  . Stress: Not on file  Relationships  . Social connections:    Talks on phone: Not on file    Gets together: Not on file    Attends religious service: Not on file    Active member of club or organization: Not on file    Attends meetings of clubs or organizations: Not on file    Relationship status: Not on file  . Intimate partner violence:    Fear of current or ex partner: Not on file    Emotionally abused: Not on file  Physically abused: Not on file    Forced sexual activity: Not on file  Other Topics Concern  . Not on file  Social History Narrative  . Not on file    Outpatient Medications Prior to Visit  Medication Sig Dispense Refill  . albuterol (PROVENTIL HFA;VENTOLIN HFA) 108 (90 Base) MCG/ACT inhaler Inhale 2 puffs into the lungs every 6 (six) hours as needed for wheezing or shortness of breath. 1 Inhaler 11  . cyclobenzaprine (FLEXERIL) 10 MG tablet Take 1 tablet (10 mg total) by mouth 2 (two) times daily as needed for muscle spasms. 60 tablet 2  . gabapentin (NEURONTIN) 100 MG capsule Take 1 capsule (100 mg total) by mouth 3 (three) times daily. For nerve pain. 90 capsule 3  . naproxen (NAPROSYN) 500 MG tablet Take 1 tablet (500 mg total) by mouth 2 (two) times daily. Regular pain and inflammation 60 tablet 3  . tiotropium (SPIRIVA HANDIHALER) 18 MCG inhalation  capsule Place 1 capsule (18 mcg total) into inhaler and inhale daily. 30 capsule 12   Facility-Administered Medications Prior to Visit  Medication Dose Route Frequency Provider Last Rate Last Dose  . TDaP (BOOSTRIX) injection 0.5 mL  0.5 mL Intramuscular Once Hensel, William A, MD        Allergies  Allergen Reactions  . Penicillins   . Sulfa Antibiotics     ROS Review of Systems    Objective:    Physical Exam  BP 122/62   Pulse 60   Temp 98.4 F (36.9 C) (Oral)   Ht 5' 7" (1.702 m)   Wt 129 lb 3.2 oz (58.6 kg)   SpO2 96%   BMI 20.24 kg/m  Wt Readings from Last 3 Encounters:  05/09/18 129 lb 3.2 oz (58.6 kg)  04/29/18 121 lb 4 oz (55 kg)  12/11/15 127 lb 12.8 oz (58 kg)   Note improved BP and 8 lb weight gain since last visit. Lungs clear Cardiac RRR without m or g Abd benign Ext no edema Neuro CN2-12 intact, Motor, sensory, gait and mentation all intact.  Health Maintenance Due  Topic Date Due  . COLONOSCOPY  12/23/2010  . INFLUENZA VACCINE  10/07/2017    There are no preventive care reminders to display for this patient.  No results found for: TSH Lab Results  Component Value Date   WBC 10.8 04/29/2018   HGB 16.0 04/29/2018   HCT 45.1 04/29/2018   MCV 96 04/29/2018   PLT 328 04/29/2018   No results found for: NA, K, CHLORIDE, CO2, GLUCOSE, BUN, CREATININE, BILITOT, ALKPHOS, AST, ALT, PROT, ALBUMIN, CALCIUM, ANIONGAP, EGFR, GFR No results found for: CHOL No results found for: HDL No results found for: LDLCALC No results found for: TRIG No results found for: CHOLHDL No results found for: HGBA1C    Assessment & Plan:   Problem List Items Addressed This Visit    Unintentional weight loss   Relevant Orders   CMP14+EGFR   TSH   Seizures (HCC)   Relevant Orders   Ambulatory referral to Neurology      No orders of the defined types were placed in this encounter.   Follow-up: No follow-ups on file.    William A Hensel, MD 

## 2018-05-09 NOTE — Assessment & Plan Note (Signed)
I am not convinced this was a true seizure.  I think likely a vagal episode while he was already weak and a bit dehydrated.  I want him to see neuro to see if they feel EEG and or neuro imaging warrented.  Will need their opinion about driving and return to work.

## 2018-05-10 LAB — CMP14+EGFR
A/G RATIO: 1.9 (ref 1.2–2.2)
ALK PHOS: 97 IU/L (ref 39–117)
ALT: 9 IU/L (ref 0–44)
AST: 14 IU/L (ref 0–40)
Albumin: 4.4 g/dL (ref 3.8–4.9)
BUN / CREAT RATIO: 9 (ref 9–20)
BUN: 7 mg/dL (ref 6–24)
Bilirubin Total: <0.2 mg/dL (ref 0.0–1.2)
CALCIUM: 9.3 mg/dL (ref 8.7–10.2)
CO2: 23 mmol/L (ref 20–29)
Chloride: 104 mmol/L (ref 96–106)
Creatinine, Ser: 0.74 mg/dL — ABNORMAL LOW (ref 0.76–1.27)
GFR calc Af Amer: 118 mL/min/{1.73_m2} (ref >59)
GFR, EST NON AFRICAN AMERICAN: 102 mL/min/{1.73_m2} (ref >59)
GLOBULIN, TOTAL: 2.3 g/dL (ref 1.5–4.5)
Glucose: 94 mg/dL (ref 65–99)
Potassium: 4.4 mmol/L (ref 3.5–5.2)
SODIUM: 141 mmol/L (ref 134–144)
Total Protein: 6.7 g/dL (ref 6.0–8.5)

## 2018-05-10 LAB — TSH: TSH: 1.86 u[IU]/mL (ref 0.450–4.500)

## 2018-05-11 ENCOUNTER — Ambulatory Visit: Payer: BLUE CROSS/BLUE SHIELD | Admitting: Diagnostic Neuroimaging

## 2018-05-11 ENCOUNTER — Encounter: Payer: Self-pay | Admitting: Diagnostic Neuroimaging

## 2018-05-11 ENCOUNTER — Telehealth: Payer: Self-pay | Admitting: Diagnostic Neuroimaging

## 2018-05-11 VITALS — BP 130/75 | HR 56 | Ht 67.0 in | Wt 125.0 lb

## 2018-05-11 DIAGNOSIS — R55 Syncope and collapse: Secondary | ICD-10-CM | POA: Diagnosis not present

## 2018-05-11 NOTE — Telephone Encounter (Signed)
[facility] pt has [facility] i faxed the order to them they will reach out to the pt to schedule. [facility] phone # [phone] & fax # [phone].

## 2018-05-11 NOTE — Progress Notes (Signed)
[facility]  PATIENT: Marcus Pennington DOB: 24-May-1960  REFERRING CLINICIAN: W Hensel HISTORY FROM: patient  REASON FOR VISIT: new consult    HISTORICAL  CHIEF COMPLAINT:  Chief Complaint  Patient presents with  . Seizures    rm 7, New Pt, dgtr- [place], "seizure at home on 04/28/18, no family hx seizures"    HISTORY OF PRESENT ILLNESS:   58 year old male here for evaluation of syncope or seizure.  Last 6 months patient has had generalized malaise, fatigue, weight loss, decreased appetite.  He was also having some GI bleeding.  April 28, 2018, patient was at home, told his family that he was not feeling good, then began to have generalized tremors and shakiness.  His daughter noted that he became very pale, stopped breathing and close his eyes.  He was able to slightly respond to verbal commands, opening his eyes and squeezing hands, but not able to communicate.  Symptoms lasted for 10 to 15 minutes and then resolved.  No tongue biting or incontinence.  Paramedics were called to scene and noted a "low blood pressure".  He was taken to local emergency room for some testing including possible CAT scan and lab testing.  That he was discharged home.  Since then no further spells or events.  No prior similar events.  No family history of seizure.   REVIEW OF SYSTEMS: Full 14 system review of systems performed and negative with exception of: Shortness of breath cough weight loss.   ALLERGIES: Allergies  Allergen Reactions  . Penicillins   . Sulfa Antibiotics     HOME MEDICATIONS: Outpatient Medications Prior to Visit  Medication Sig Dispense Refill  . albuterol (PROVENTIL HFA;VENTOLIN HFA) 108 (90 Base) MCG/ACT inhaler Inhale 2 puffs into the lungs every 6 (six) hours as needed for wheezing or shortness of breath. 1 Inhaler 11  . tiotropium (SPIRIVA HANDIHALER) 18 MCG inhalation capsule Place 1 capsule (18 mcg total) into inhaler and inhale daily. 30  capsule 12  . cyclobenzaprine (FLEXERIL) 10 MG tablet Take 1 tablet (10 mg total) by mouth 2 (two) times daily as needed for muscle spasms. 60 tablet 2  . gabapentin (NEURONTIN) 100 MG capsule Take 1 capsule (100 mg total) by mouth 3 (three) times daily. For nerve pain. 90 capsule 3  . naproxen (NAPROSYN) 500 MG tablet Take 1 tablet (500 mg total) by mouth 2 (two) times daily. Regular pain and inflammation 60 tablet 3   Facility-Administered Medications Prior to Visit  Medication Dose Route Frequency Provider Last Rate Last Dose  . TDaP (BOOSTRIX) injection 0.5 mL  0.5 mL Intramuscular Once Zenia Resides, MD        PAST MEDICAL HISTORY: Past Medical History:  Diagnosis Date  . COPD (chronic obstructive pulmonary disease) ([place])   . Emphysema of lung ([place])   . Seizures ([place]) 04/28/2018    PAST SURGICAL HISTORY: No past surgical history on file.  FAMILY HISTORY: Family History  Problem Relation Age of Onset  . Depression Mother   . Arthritis Mother   . Hypertension Mother   . Cancer Mother   . Diabetes Mother   . Prostate cancer Father   . Diabetes Sister     SOCIAL HISTORY: Social History   Socioeconomic History  . Marital status: Divorced    Spouse name: Not on file  . Number of children: 2  . Years of education: GED  . Highest education level: Not on file  Occupational History  Comment: [employer]  Social Needs  . Financial resource strain: Not on file  . Food insecurity:    Worry: Not on file    Inability: Not on file  . Transportation needs:    Medical: Not on file    Non-medical: Not on file  Tobacco Use  . Smoking status: Current Every Day Smoker    Packs/day: 1.00    Years: 33.00    Pack years: 33.00    Types: Cigarettes  . Smokeless tobacco: Never Used  Substance and Sexual Activity  . Alcohol use: No  . Drug use: No  . Sexual activity: Not Currently  Lifestyle  . Physical activity:    Days per week: Not on file    Minutes per session: Not  on file  . Stress: Not on file  Relationships  . Social connections:    Talks on phone: Not on file    Gets together: Not on file    Attends religious service: Not on file    Active member of club or organization: Not on file    Attends meetings of clubs or organizations: Not on file    Relationship status: Not on file  . Intimate partner violence:    Fear of current or ex partner: Not on file    Emotionally abused: Not on file    Physically abused: Not on file    Forced sexual activity: Not on file  Other Topics Concern  . Not on file  Social History Narrative   Lives with daughter   Caffeine - Pepsi 2-3 daily     PHYSICAL EXAM  GENERAL EXAM/CONSTITUTIONAL: Vitals:  Vitals:   05/11/18 0952  BP: 130/75  Pulse: (!) 56  Weight: 125 lb (56.7 kg)  Height: 5\' 7"  (1.702 m)     Body mass index is 19.58 kg/m. Wt Readings from Last 3 Encounters:  05/11/18 125 lb (56.7 kg)  05/09/18 129 lb 3.2 oz (58.6 kg)  04/29/18 121 lb 4 oz (55 kg)     Patient is in no distress; well developed, nourished and groomed; neck is supple  CARDIOVASCULAR:  Examination of carotid arteries is normal; no carotid bruits  Regular rate and rhythm, no murmurs  Examination of peripheral vascular system by observation and palpation is normal  EYES:  Ophthalmoscopic exam of optic discs and posterior segments is normal; no papilledema or hemorrhages  Visual Acuity Screening   Right eye Left eye Both eyes  Without correction:     With correction: 20/40 20/30      MUSCULOSKELETAL:  Gait, strength, tone, movements noted in Neurologic exam below  NEUROLOGIC: MENTAL STATUS:  No flowsheet data found.  awake, alert, oriented to person, place and time  recent and remote memory intact  normal attention and concentration  language fluent, comprehension intact, naming intact  fund of knowledge appropriate  CRANIAL NERVE:   2nd - no papilledema on fundoscopic exam  2nd, 3rd, 4th,  6th - pupils equal and reactive to light, visual fields full to confrontation, extraocular muscles intact, no nystagmus  5th - facial sensation symmetric  7th - facial strength symmetric  8th - hearing intact  9th - palate elevates symmetrically, uvula midline  11th - shoulder shrug symmetric  12th - tongue protrusion midline  MOTOR:   normal bulk and tone, full strength in the BUE, BLE  SENSORY:   normal and symmetric to light touch, temperature, vibration  COORDINATION:   finger-nose-finger, fine finger movements normal  REFLEXES:   deep  tendon reflexes present and symmetric  GAIT/STATION:   narrow based gait    DIAGNOSTIC DATA (LABS, IMAGING, TESTING) - I reviewed patient records, labs, notes, testing and imaging myself where available.  Lab Results  Component Value Date   WBC 10.8 04/29/2018   HGB 16.0 04/29/2018   HCT 45.1 04/29/2018   MCV 96 04/29/2018   PLT 328 04/29/2018      Component Value Date/Time   NA 141 05/09/2018 1226   K 4.4 05/09/2018 1226   CL 104 05/09/2018 1226   CO2 23 05/09/2018 1226   GLUCOSE 94 05/09/2018 1226   BUN 7 05/09/2018 1226   CREATININE 0.74 (L) 05/09/2018 1226   CALCIUM 9.3 05/09/2018 1226   PROT 6.7 05/09/2018 1226   ALBUMIN 4.4 05/09/2018 1226   AST 14 05/09/2018 1226   ALT 9 05/09/2018 1226   ALKPHOS 97 05/09/2018 1226   BILITOT <0.2 05/09/2018 1226   GFRNONAA 102 05/09/2018 1226   GFRAA 118 05/09/2018 1226   No results found for: CHOL, HDL, LDLCALC, LDLDIRECT, TRIG, CHOLHDL No results found for: HGBA1C No results found for: VITAMINB12 Lab Results  Component Value Date   TSH 1.860 05/09/2018      ASSESSMENT AND PLAN  58 y.o. 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   Dx:  1. Convulsive syncope     PLAN:  SYNCOPE vs SEIZURE  - check MRI brain and EEG  - According to [place] law, you can not drive unless you are seizure / syncope free for at  least 6 months and under physician's care.   - Please maintain precautions. Do not participate in activities where a loss of awareness could harm you or someone else. No swimming alone, no tub bathing, no hot tubs, no driving, no operating motorized vehicles (cars, ATVs, motocycles, etc), lawnmowers, power tools or firearms. No standing at heights, such as rooftops, ladders or stairs. Avoid hot objects such as stoves, heaters, open fires. Wear a helmet when riding a bicycle, scooter, skateboard, etc. and avoid areas of traffic. Set your water heater to 120 degrees or less.  Orders Placed This Encounter  Procedures  . MR BRAIN W WO CONTRAST  . EEG adult   Return for pending if symptoms worsen or fail to improve.    Penni Bombard, MD 07/11/7320, 02:54 AM Certified in Neurology, Neurophysiology and Neuroimaging  [facility] [address], [place], [place] [address] [phone]

## 2018-05-11 NOTE — Patient Instructions (Signed)
SYNCOPE (passing out) vs SEIZURE  - check MRI brain and EEG  - According to [place] law, you can not drive unless you are seizure / syncope free for at least 6 months and under physician's care.   - Please maintain precautions. Do not participate in activities where a loss of awareness could harm you or someone else. No swimming alone, no tub bathing, no hot tubs, no driving, no operating motorized vehicles (cars, ATVs, motocycles, etc), lawnmowers, power tools or firearms. No standing at heights, such as rooftops, ladders or stairs. Avoid hot objects such as stoves, heaters, open fires. Wear a helmet when riding a bicycle, scooter, skateboard, etc. and avoid areas of traffic. Set your water heater to 120 degrees or less.

## 2018-05-12 NOTE — Telephone Encounter (Signed)
Patient is scheduled at [facility] for 05/17/18 at 12:15 pm.

## 2018-05-17 ENCOUNTER — Telehealth: Payer: Self-pay

## 2018-05-17 NOTE — Telephone Encounter (Signed)
Patient needs to speak to PCP or team about FMLA papers. Something was not filled out correctly and needs to be corrected.  Left call back number of [phone]  Danley Danker, RN [facility] RN)

## 2018-05-17 NOTE — Telephone Encounter (Signed)
Contacted pt in reference to this and he stated that the note that was given to him stated that on Pg 4 #7 must be completely filled out.  Once completed to fax it back to her. Katharina Caper, Arisbeth Purrington D, [place]

## 2018-05-17 NOTE — Telephone Encounter (Signed)
Pt informed that I refaxed now.  Copy placed in scan pile. Fleeger, Salome Spotted, CMA

## 2018-05-17 NOTE — Telephone Encounter (Signed)
Revised, signed and dated.  I apologize for my oversight.

## 2018-05-18 ENCOUNTER — Telehealth: Payer: Self-pay | Admitting: *Deleted

## 2018-05-18 NOTE — Telephone Encounter (Signed)
MRI brain report from [facility] on Dr [facility] desk for review.

## 2018-05-19 NOTE — Telephone Encounter (Signed)
Called patient to inform MRI brain results. Son-in-law answered, stated patient would be home after 4 pm. I advised I will call back tomorrow.

## 2018-05-20 NOTE — Telephone Encounter (Signed)
Called patient who wasn't home. Daughter, Candace answered, on [place]. I informed her Dr Leta Baptist reviewed report of MRI brain from [facility]. I advised her it did not show definite seizure focus, however he must abide by [place] law and not drive x 6 months and remain under dr's care. She stated she was not letting him drive at all. I advised her he needs to schedule EEG. She stated he is at work, and she'll need to talk to him before scheduling as his work is not wanting to let him off. She will call back Monday. I advised her the phone staff can schedule EEG. She verbalized understanding, appreciation.

## 2018-05-26 ENCOUNTER — Encounter: Payer: Self-pay | Admitting: Gastroenterology

## 2018-05-26 NOTE — Telephone Encounter (Signed)
Pt daughter (Candace)was called to discuss available dates for the EEG which was ordered by Dr Leta Baptist.  A voicemail was left for pt's daughter to call to schedule

## 2018-05-26 NOTE — Telephone Encounter (Signed)
LVM for daughter, Hal Hope advising her that there's an opening next Wed, openings April 1,2nd and the following week. I also advised we can put patient on a wait list in case someone cancels before his appointment. I advised she call back and schedule EEG and get on wait list. .

## 2018-05-26 NOTE — Telephone Encounter (Signed)
Patients daughter called back in to schedule EEG , not able to schedule as soon as they want it

## 2018-05-31 ENCOUNTER — Telehealth: Payer: Self-pay | Admitting: *Deleted

## 2018-05-31 NOTE — Telephone Encounter (Signed)
Dr Wilma Flavin and Osvaldo Angst, This patient to ED on 04/29/2018 with "convulsive syncope) Patient being worked up by neurology. MRI at [facility] on 05/17/2018 and EEG scheduled for 06/08/2018. Patient is scheduled for Colonoscopy for rectal bleeding on 06/27/2018. Last Hgb 16. Thank you for reviewing this record for appropriateness at [facility] at this time

## 2018-05-31 NOTE — Telephone Encounter (Signed)
Dr Wilma Flavin and Osvaldo Angst, Hello, this patient with "convulsive syncope" in February of this year. Patient is presently being worked up by Garment/textile technologist. MRI at [facility] on 05/17/2018 and EEG scheduled for 06/08/2018.No seizure meds at present. Patient scheduled for Colonoscopy for rectal bleeding on 06/27/2018. Last Hgb high normal, 16. Please advise regarding appropriateness at this time for [facility] procedure.

## 2018-05-31 NOTE — Telephone Encounter (Signed)
I have never seen this patient but sounds like he needs to have his seizure workup completely prior to moving forward with colonoscopy. I would hold off on the procedure for 4/20, we can rebook him once his Neurologic workup is complete. John, can you please clarify how long he needs to be seizure free prior to proceeding at the [facility]? Thanks

## 2018-05-31 NOTE — Telephone Encounter (Signed)
Called patient's daughter. Previsit and procedure cancelled as directed by Dr Wilma Flavin. Daughter verbalized understanding and agreement with plan.

## 2018-06-08 ENCOUNTER — Ambulatory Visit: Payer: BLUE CROSS/BLUE SHIELD | Admitting: Diagnostic Neuroimaging

## 2018-06-08 ENCOUNTER — Other Ambulatory Visit: Payer: Self-pay

## 2018-06-08 DIAGNOSIS — R55 Syncope and collapse: Secondary | ICD-10-CM

## 2018-06-21 ENCOUNTER — Other Ambulatory Visit: Payer: Self-pay | Admitting: *Deleted

## 2018-06-21 DIAGNOSIS — R0602 Shortness of breath: Secondary | ICD-10-CM

## 2018-06-21 MED ORDER — TIOTROPIUM BROMIDE MONOHYDRATE 18 MCG IN CAPS: 18.0000 ug | ORAL_CAPSULE | Freq: Every day | RESPIRATORY_TRACT | 3 refills | Status: DC

## 2018-06-21 NOTE — Telephone Encounter (Signed)
Pt is requesting a 90 day supply.  Christen Bame, CMA

## 2018-06-27 ENCOUNTER — Encounter: Payer: BLUE CROSS/BLUE SHIELD | Admitting: Gastroenterology

## 2018-07-14 NOTE — Procedures (Signed)
   [facility]  EEG (ELECTROENCEPHALOGRAM) REPORT   STUDY DATE: 06/08/18 PATIENT NAME: Marcus Pennington DOB: 08-Dec-1960 MRN: [id number]  ORDERING CLINICIAN: Andrey Spearman, MD   TECHNOLOGIST: Arelia Longest  TECHNIQUE: Electroencephalogram was recorded utilizing standard 10-20 system of lead placement and reformatted into average and bipolar montages.  RECORDING TIME: 23 minutes  ACTIVATION: hyperventilation and photic stimulation  CLINICAL INFORMATION: 58 year old male with new onset seizure.  FINDINGS: Posterior dominant background rhythms, which attenuate with eye opening, ranging 10-11 hertz and 20-30 microvolts. No focal, lateralizing, epileptiform activity or seizures are seen. Patient recorded in the awake and drowsy state.     IMPRESSION:   Normal EEG in the awake and drowsy states.    INTERPRETING PHYSICIAN:  Penni Bombard, MD Certified in Neurology, Neurophysiology and Neuroimaging  [facility] [address], [place], [place] [address] [phone]

## 2018-07-18 ENCOUNTER — Telehealth: Payer: Self-pay | Admitting: *Deleted

## 2018-07-18 NOTE — Telephone Encounter (Signed)
LVM requesting call back re: EEG result.

## 2018-07-21 ENCOUNTER — Telehealth: Payer: Self-pay | Admitting: *Deleted

## 2018-07-21 NOTE — Telephone Encounter (Signed)
LVM advising patient his EEG was normal and advised he continue to adhere to [place] law and to drive, to avoid potentially dangerous activities as outlined by Dr Leta Baptist. Left number, advised we're closed on Fridays at this time.

## 2019-05-08 ENCOUNTER — Other Ambulatory Visit: Payer: Self-pay | Admitting: Family Medicine

## 2019-05-08 DIAGNOSIS — R0602 Shortness of breath: Secondary | ICD-10-CM

## 2019-05-16 ENCOUNTER — Other Ambulatory Visit: Payer: Self-pay | Admitting: Family Medicine

## 2019-05-16 DIAGNOSIS — R0602 Shortness of breath: Secondary | ICD-10-CM

## 2019-10-16 ENCOUNTER — Encounter: Payer: Self-pay | Admitting: Family Medicine

## 2019-10-16 ENCOUNTER — Other Ambulatory Visit: Payer: Self-pay

## 2019-10-16 ENCOUNTER — Ambulatory Visit (INDEPENDENT_AMBULATORY_CARE_PROVIDER_SITE_OTHER): Payer: BC Managed Care – PPO | Admitting: Family Medicine

## 2019-10-16 VITALS — BP 136/78 | HR 97

## 2019-10-16 DIAGNOSIS — J441 Chronic obstructive pulmonary disease with (acute) exacerbation: Secondary | ICD-10-CM | POA: Diagnosis not present

## 2019-10-16 DIAGNOSIS — F17200 Nicotine dependence, unspecified, uncomplicated: Secondary | ICD-10-CM

## 2019-10-16 DIAGNOSIS — Z23 Encounter for immunization: Secondary | ICD-10-CM | POA: Diagnosis not present

## 2019-10-16 DIAGNOSIS — J449 Chronic obstructive pulmonary disease, unspecified: Secondary | ICD-10-CM | POA: Diagnosis not present

## 2019-10-16 MED ORDER — PREDNISONE 20 MG PO TABS: 40.0000 mg | ORAL_TABLET | Freq: Every day | ORAL | 0 refills | Status: DC

## 2019-10-16 MED ORDER — UMECLIDINIUM-VILANTEROL 62.5-25 MCG/INH IN AEPB: 1.0000 | INHALATION_SPRAY | Freq: Every day | RESPIRATORY_TRACT | 3 refills | Status: DC

## 2019-10-16 MED ORDER — VARENICLINE TARTRATE 0.5 MG PO TABS: 0.5000 mg | ORAL_TABLET | Freq: Two times a day (BID) | ORAL | 3 refills | Status: DC

## 2019-10-16 NOTE — Progress Notes (Signed)
    SUBJECTIVE:   CHIEF COMPLAINT / HPI:   Fu COPD.  Patient seen at [facility] for COPD exac on 6/29 21.  Treaed with 5 days of pred and doxy.  Continues to wheeze and be quite short of breath.  Continues to smoke.  He feels his spiriva "isn't doing anything."  Relying heavily on albuterol MDI.   He has not been COVID vaccinated.  He is willing to get his first vaccine now. He is interested in smoking cessation.  Wants to try chantix.      OBJECTIVE:   BP 136/78   Pulse 97   SpO2 92%   Lungs prolonged exp phase in all lung fields with diffuse exp wheeze.  Pulse ox noted Cardiac RRR without m or g   ASSESSMENT/PLAN:   TOBACCO USER Motivated to quit.  Start Chantix per patient request.  COPD (chronic obstructive pulmonary disease) Poor control on LAMA only.  Switch to LAMA/LABA combo.  COPD with acute exacerbation (HCC) Redose with prednisone.    High priority for COVID-19 virus vaccination First dose today.  Second dose in 3 weeks at follow up.     Zenia Resides, MD [place]

## 2019-10-16 NOTE — Assessment & Plan Note (Signed)
First dose today.  Second dose in 3 weeks at follow up.

## 2019-10-16 NOTE — Assessment & Plan Note (Signed)
Motivated to quit.  Start Chantix per patient request.

## 2019-10-16 NOTE — Patient Instructions (Signed)
I will give you a work note. I sent in chantix.  The single most important thing for your long term health is to quit smoking.   I sent in another 5 days of prednisone.  You can't stay on it long term. Stop the spiriva.  I sent in a new inhaler with 2 medicines in it.   1st COVOD shot today. [facility] Setters will make you an appointment for follow up and second shot.

## 2019-10-16 NOTE — Assessment & Plan Note (Signed)
Redose with prednisone.

## 2019-10-16 NOTE — Assessment & Plan Note (Signed)
Poor control on LAMA only.  Switch to LAMA/LABA combo.

## 2019-10-23 ENCOUNTER — Other Ambulatory Visit: Payer: Self-pay | Admitting: Family Medicine

## 2019-10-23 DIAGNOSIS — R0602 Shortness of breath: Secondary | ICD-10-CM

## 2019-11-09 ENCOUNTER — Other Ambulatory Visit: Payer: Self-pay

## 2019-11-09 ENCOUNTER — Ambulatory Visit (INDEPENDENT_AMBULATORY_CARE_PROVIDER_SITE_OTHER): Payer: BC Managed Care – PPO | Admitting: Family Medicine

## 2019-11-09 ENCOUNTER — Encounter: Payer: Self-pay | Admitting: Family Medicine

## 2019-11-09 VITALS — BP 104/60 | HR 64 | Ht 67.0 in | Wt 126.4 lb

## 2019-11-09 DIAGNOSIS — J449 Chronic obstructive pulmonary disease, unspecified: Secondary | ICD-10-CM

## 2019-11-09 DIAGNOSIS — F17200 Nicotine dependence, unspecified, uncomplicated: Secondary | ICD-10-CM | POA: Diagnosis not present

## 2019-11-09 DIAGNOSIS — Z23 Encounter for immunization: Secondary | ICD-10-CM

## 2019-11-09 DIAGNOSIS — R634 Abnormal weight loss: Secondary | ICD-10-CM | POA: Diagnosis not present

## 2019-11-09 NOTE — Patient Instructions (Addendum)
Check with the drug store and ask when they will get new batches of chantix.  Only a few batches were recalled.   Call me with the name of a pharmacy if yours won't have Chantix for a while.   I am delighted you are better and committed to quitting smoking. Get home and take some tylenol regularly for the next day or two to combat the side effects of the flu and COVID vaccines.

## 2019-11-10 ENCOUNTER — Encounter: Payer: Self-pay | Admitting: Family Medicine

## 2019-11-10 NOTE — Assessment & Plan Note (Signed)
Improved on current meds.

## 2019-11-10 NOTE — Assessment & Plan Note (Signed)
He will retry filling chantix rx.

## 2019-11-10 NOTE — Assessment & Plan Note (Signed)
resolved 

## 2019-11-10 NOTE — Progress Notes (Signed)
    SUBJECTIVE:   CHIEF COMPLAINT / HPI:   FU COPD and vaccination. Mr Marcus Pennington feels markedly improved on new meds.  Much less wheezing and less use of rescue inhaler.  Wishes to stay on current meds.  Cutting back on smoking.  Could not get chantix because of a recall.  Investigateed.  Only certain lots recalled.    Needs COVID dose #2 and flu shot today.  Would like a letter for work limitinghim to 40 hour weeks for another two months.  Note weight has rebounded from his unintentional wt loss.  OBJECTIVE:   BP 104/60   Pulse 64   Ht 5\' 7"  (1.702 m)   Wt 126 lb 6.4 oz (57.3 kg)   SpO2 97%   BMI 19.80 kg/m   Lungs clear, no wheeze.  Still with prolonged exp phase.  ASSESSMENT/PLAN:   COPD (chronic obstructive pulmonary disease) Improved on current meds.  Unintentional weight loss resolved  TOBACCO USER He will retry filling chantix rx.  High priority for COVID-19 virus vaccination Flu and Covid #2 vaccines today.     Zenia Resides, MD [place]

## 2019-11-10 NOTE — Assessment & Plan Note (Signed)
Flu and Covid #2 vaccines today.

## 2019-12-07 ENCOUNTER — Other Ambulatory Visit: Payer: Self-pay | Admitting: Family Medicine

## 2019-12-07 DIAGNOSIS — J449 Chronic obstructive pulmonary disease, unspecified: Secondary | ICD-10-CM

## 2020-06-01 ENCOUNTER — Other Ambulatory Visit: Payer: Self-pay | Admitting: Family Medicine

## 2020-06-01 DIAGNOSIS — R0602 Shortness of breath: Secondary | ICD-10-CM

## 2020-07-08 ENCOUNTER — Other Ambulatory Visit: Payer: Self-pay

## 2020-07-08 ENCOUNTER — Ambulatory Visit (INDEPENDENT_AMBULATORY_CARE_PROVIDER_SITE_OTHER): Payer: BC Managed Care – PPO | Admitting: Family Medicine

## 2020-07-08 ENCOUNTER — Ambulatory Visit (INDEPENDENT_AMBULATORY_CARE_PROVIDER_SITE_OTHER): Payer: BC Managed Care – PPO

## 2020-07-08 ENCOUNTER — Encounter: Payer: Self-pay | Admitting: Family Medicine

## 2020-07-08 VITALS — BP 116/64 | HR 79 | Ht 67.0 in | Wt 129.4 lb

## 2020-07-08 DIAGNOSIS — J449 Chronic obstructive pulmonary disease, unspecified: Secondary | ICD-10-CM

## 2020-07-08 DIAGNOSIS — Z23 Encounter for immunization: Secondary | ICD-10-CM | POA: Diagnosis not present

## 2020-07-08 DIAGNOSIS — Z13220 Encounter for screening for lipoid disorders: Secondary | ICD-10-CM | POA: Diagnosis not present

## 2020-07-08 DIAGNOSIS — Z1211 Encounter for screening for malignant neoplasm of colon: Secondary | ICD-10-CM | POA: Insufficient documentation

## 2020-07-08 DIAGNOSIS — Z609 Problem related to social environment, unspecified: Secondary | ICD-10-CM

## 2020-07-08 DIAGNOSIS — F17200 Nicotine dependence, unspecified, uncomplicated: Secondary | ICD-10-CM | POA: Diagnosis not present

## 2020-07-08 MED ORDER — NICOTINE 21 MG/24HR TD PT24
21.0000 mg | MEDICATED_PATCH | Freq: Every day | TRANSDERMAL | 0 refills | Status: DC
Start: 2020-07-08 — End: 2020-09-16

## 2020-07-08 NOTE — Patient Instructions (Addendum)
I hope I filled out th paperwork properly.  Let me if I need to do more. I sent in a prescription for the nicotine patch.  Good luck in quitting.  Someone should call about the colonoscopy. I will call with the blood test results You get your third covid shot today.   Also pneumonia vaccine today.

## 2020-07-09 ENCOUNTER — Encounter: Payer: Self-pay | Admitting: Family Medicine

## 2020-07-09 DIAGNOSIS — Z609 Problem related to social environment, unspecified: Secondary | ICD-10-CM | POA: Insufficient documentation

## 2020-07-09 LAB — LIPID PANEL
Chol/HDL Ratio: 4 ratio (ref 0.0–5.0)
Cholesterol, Total: 144 mg/dL (ref 100–199)
HDL: 36 mg/dL — ABNORMAL LOW (ref >39)
LDL Chol Calc (NIH): 91 mg/dL (ref 0–99)
Triglycerides: 91 mg/dL (ref 0–149)
VLDL Cholesterol Cal: 17 mg/dL (ref 5–40)

## 2020-07-09 MED ORDER — PRAVASTATIN SODIUM 40 MG PO TABS: 40.0000 mg | ORAL_TABLET | Freq: Every day | ORAL | 3 refills | Status: DC

## 2020-07-09 NOTE — Progress Notes (Signed)
    SUBJECTIVE:   CHIEF COMPLAINT / HPI:   FMLA paperwork and more. 1. FMLA paperwork.  Renewal.  I have limited to 40 hours per week due to COPD.  That limit continues to be reasonable.  Without this FMLA paperwork, he would be subject to mandatory overtime. 2. COPD.  Can afford (barely) his anoro.  No recent exacerabations.  His work capacity remains OK.  He is exhausted at the end of the workweek.   3. Immunizations: With his COPD, he is at high risk for serious complications with pulmonary infections.  Due for a covid booster.  Also due for pneumovax, which he has never received.  Wants both today since it is hard for him to get off work.  He is aware that both may cause mild flu like symptoms. 4. Financial difficulties.  He is the primary breadwinner of the family and supports his daughter, her husband and their children.  Without his income, "I believe we would lose everything." 5. HPDP, also due for colon cancer screen 6. Tobacco abuse.  Continues to smoke.  Insurance would not cover Chantix.  Wants to try patch.  Currently smokes one PPD.    OBJECTIVE:   BP 116/64   Pulse 79   Ht 5\' 7"  (1.702 m)   Wt 129 lb 6.4 oz (58.7 kg)   SpO2 93%   BMI 20.27 kg/m   Lungs, prolonged exp phase, no wheeze.  Cardiac RRR without m or g Ext no edema  ASSESSMENT/PLAN:   Screening cholesterol level ASCVD risk calculates to 10.3% based on visit and lab numbers.  Smoking cessation is key. Tried to call to discuss statin.  Phone number not working.  I sent a letter.  Colon cancer screening GI referral placed.  COPD (chronic obstructive pulmonary disease) Stable on current meds.  TOBACCO USER Smoking cessation is the single biggest thing he could do to improve his health.  High risk social situation Finances marginal.  He is primary breadwinner despite his chronic health issues.  He is under considerable pressure to keep the family solvent.     Zenia Resides, MD [place]

## 2020-07-09 NOTE — Assessment & Plan Note (Signed)
GI referral placed

## 2020-07-09 NOTE — Assessment & Plan Note (Signed)
Stable on current meds 

## 2020-07-09 NOTE — Assessment & Plan Note (Signed)
Smoking cessation is the single biggest thing he could do to improve his health.

## 2020-07-09 NOTE — Assessment & Plan Note (Signed)
ASCVD risk calculates to 10.3% based on visit and lab numbers.  Smoking cessation is key. Tried to call to discuss statin.  Phone number not working.  I sent a letter.

## 2020-07-09 NOTE — Assessment & Plan Note (Signed)
Finances marginal.  He is primary breadwinner despite his chronic health issues.  He is under considerable pressure to keep the family solvent.

## 2020-07-22 ENCOUNTER — Encounter: Payer: Self-pay | Admitting: Nurse Practitioner

## 2020-08-13 ENCOUNTER — Encounter: Payer: Self-pay | Admitting: Nurse Practitioner

## 2020-08-13 ENCOUNTER — Other Ambulatory Visit (INDEPENDENT_AMBULATORY_CARE_PROVIDER_SITE_OTHER): Payer: BC Managed Care – PPO

## 2020-08-13 ENCOUNTER — Ambulatory Visit (INDEPENDENT_AMBULATORY_CARE_PROVIDER_SITE_OTHER): Payer: BC Managed Care – PPO | Admitting: Nurse Practitioner

## 2020-08-13 VITALS — BP 110/80 | HR 85 | Ht 67.0 in | Wt 129.2 lb

## 2020-08-13 DIAGNOSIS — K625 Hemorrhage of anus and rectum: Secondary | ICD-10-CM | POA: Diagnosis not present

## 2020-08-13 DIAGNOSIS — J449 Chronic obstructive pulmonary disease, unspecified: Secondary | ICD-10-CM

## 2020-08-13 LAB — CBC WITH DIFFERENTIAL/PLATELET
Basophils Absolute: 0.1 10*3/uL (ref 0.0–0.1)
Basophils Relative: 0.6 % (ref 0.0–3.0)
Eosinophils Absolute: 0.4 10*3/uL (ref 0.0–0.7)
Eosinophils Relative: 3.2 % (ref 0.0–5.0)
HCT: 45.5 % (ref 39.0–52.0)
Hemoglobin: 15.9 g/dL (ref 13.0–17.0)
Lymphocytes Relative: 22.6 % (ref 12.0–46.0)
Lymphs Abs: 2.9 10*3/uL (ref 0.7–4.0)
MCHC: 35 g/dL (ref 30.0–36.0)
MCV: 94.6 fl (ref 78.0–100.0)
Monocytes Absolute: 0.9 10*3/uL (ref 0.1–1.0)
Monocytes Relative: 7.3 % (ref 3.0–12.0)
Neutro Abs: 8.5 10*3/uL — ABNORMAL HIGH (ref 1.4–7.7)
Neutrophils Relative %: 66.3 % (ref 43.0–77.0)
Platelets: 326 10*3/uL (ref 150.0–400.0)
RBC: 4.81 Mil/uL (ref 4.22–5.81)
RDW: 14 % (ref 11.5–15.5)
WBC: 12.9 10*3/uL — ABNORMAL HIGH (ref 4.0–10.5)

## 2020-08-13 NOTE — Progress Notes (Signed)
08/13/2020 Marcus Pennington [id number] 1960/07/01   CHIEF COMPLAINT: Schedule a colonoscopy  HISTORY OF PRESENT ILLNESS:  Marcus Pennington is a 60 year old male with a past medical history of COPD, chronic smoke and seizure vs syncope x 1 episode on 04/2018 without recurrence.  S/P right wrist surgery at the age of 68.  He presents to our office today as referred by Dr. Andria Frames to schedule a screening colonoscopy.  He denies ever having a screening colonoscopy.  He is passing a normal formed brown bowel movement daily.  He occasionally sees a small amount of bright red blood on the toilet tissue and in the toilet water which is separate from the stool.  No associated anorectal pain.  No upper or lower abdominal pain. His maternal grandmother was diagnosed with colon cancer in her 67s.  He smokes 1 pack of cigarettes since the age of 52, he is trying to cut back.  History of COPD using Anoro inhaler daily and albuterol as needed.  He does not require home oxygen.  No fever, sweats or chills.  No weight loss.  No chest pain or palpitations.  No dysphagia or heartburn.  No other complaints at this time.  He had a syncopal verses seizure episode at home on 04/28/2018. EMS was called by his family, his BP was low and he was evaluated at [facility] ED. Head CT was negative and he was discharged home. He was evaluated as an outpatient by neurologist Dr. Andrey Spearman on 05/11/2018. He underwent a brain MRI on 3/11/20220 which was negative. An EEG 07/2018 was normal. No further seizure or syncopal episodes since 04/28/2018.   Past Medical History:  Diagnosis Date  . COPD (chronic obstructive pulmonary disease) ([place])   . Emphysema of lung ([place])   . Seizures ([place]) 04/28/2018   Social History: He smokes cigarettes once daily since age 33. No alcohol or drug use.   Family History:  Mother breast cancer, diabetes and diverticulitis. Father had heart disease. Maternal grandmother with colon cancer diagnosed in her  93's. Daughter with IBS.   Allergies  Allergen Reactions  . Penicillins   . Sulfa Antibiotics       Outpatient Encounter Medications as of 08/13/2020  Medication Sig  . albuterol (VENTOLIN HFA) 108 (90 Base) MCG/ACT inhaler TAKE 2 PUFFS BY MOUTH EVERY 6 HOURS AS NEEDED FOR WHEEZE OR SHORTNESS OF BREATH  . ANORO ELLIPTA 62.5-25 MCG/INH AEPB TAKE 1 PUFF BY MOUTH EVERY DAY  . nicotine (NICODERM CQ - DOSED IN MG/24 HOURS) 21 mg/24hr patch Place 1 patch (21 mg total) onto the skin daily.  . pravastatin (PRAVACHOL) 40 MG tablet Take 1 tablet (40 mg total) by mouth at bedtime.   Facility-Administered Encounter Medications as of 08/13/2020  Medication  . TDaP (BOOSTRIX) injection 0.5 mL   REVIEW OF SYSTEMS:  Gen: Denies fever, sweats or chills. No weight loss.  CV: Denies chest pain, palpitations or edema. Resp: See HPI. GI: See HPI.  No GERD symptoms. GU : Denies urinary burning, blood in urine, increased urinary frequency or incontinence. MS: Denies joint pain, muscles aches or weakness. Derm: Denies rash, itchiness, skin lesions or unhealing ulcers. Psych: Denies depression, anxiety or memory loss. Heme: Denies bruising, bleeding. Neuro:  Denies headaches, dizziness or paresthesias. Endo:  Denies any problems with DM, thyroid or adrenal function.  PHYSICAL EXAM: BP 110/80   Pulse 85   Ht 5\' 7"  (1.702 m)   Wt 129 lb 3.2 oz (  58.6 kg)   SpO2 96%   BMI 20.24 kg/m  General: Thin 60 year old male in no acute distress. Head: Normocephalic and atraumatic. Eyes:  Sclerae non-icteric, conjunctive pink. Ears: Normal auditory acuity. Mouth: Poor lower dentition with significant dental carry to the right lower molar area, absent upper dentition.  No ulcers or lesions.  Neck: Supple, no lymphadenopathy or thyromegaly.  Lungs: Clear bilaterally to auscultation without wheezes, crackles or rhonchi. Heart: Regular rate and rhythm. No murmur, rub or gallop appreciated.  Abdomen: Soft,  nontender, non distended. No masses. No hepatosplenomegaly. Normoactive bowel sounds x 4 quadrants.  Rectal: Patient declined exam, to proceed with colonoscopy for further evaluation. Musculoskeletal: Symmetrical with no gross deformities. Skin: Warm and dry. No rash or lesions on visible extremities. Extremities: No edema. Neurological: Alert oriented x 4, no focal deficits.  Psychological:  Alert and cooperative. Normal mood and affect.  ASSESSMENT AND PLAN:  33.  60 year old male presents to schedule a screening colonoscopy.  Positive family history of colon cancer (maternal grandmother).  -Colonoscopy benefits and risks discussed including risk with sedation, risk of bleeding, perforation and infection   2.  Intermittent rectal bleeding, presumed hemorrhoidal -Colonoscopy as ordered above -Benefiber 1 tablespoon daily -MiraLAX nightly as needed to avoid straining -Apply a small amount of Desitin inside the anal opening and to the external anal area tid as needed for anal or hemorrhoidal irritation/bleeding -CBC  3. COPD -COPD symptoms are stable at this time.  Patient to call our office if he has COPD exacerbation prior to his colonoscopy date. -Smoking cessation encouraged.   4. Syncopal vs seizure episode x 1 04/2018 without recurrence, negative neuro evaluation   5.  Significant dental caries -Recommended dental evaluation  Further follow-up to be determined after the above evaluation completed        CC:  Hensel, Jamal Collin, MD

## 2020-08-13 NOTE — Patient Instructions (Signed)
LABS:  Lab work has been ordered for you today. Our lab is located in the basement. Press "B" on the elevator. The lab is located at the first door on the left as you exit the elevator.  HEALTHCARE LAWS AND MY CHART RESULTS: Due to recent changes in healthcare laws, you may see the results of your imaging and laboratory studies on MyChart before your provider has had a chance to review them.   We understand that in some cases there may be results that are confusing or concerning to you. Not all laboratory results come back in the same time frame and the provider may be waiting for multiple results in order to interpret others.  Please give [place] 48 hours in order for your provider to thoroughly review all the results before contacting the office for clarification of your results.   PROCEDURES: You have been scheduled for a colonoscopy. Please follow the written instructions given to you at your visit today. If you use inhalers (even only as needed), please bring them with you on the day of your procedure.  RECOMMENDATIONS: Benefiber- 1 tablespoon daily. Miralax- Dissolve one capful in 8 ounces of water and drink before bed. Desitin: Apply a small amount to the external anal area three times a day as needed.  Please call our office if your symptoms worsen.  It was great seeing you today! Thank you for entrusting me with your care and choosing [facility].  Noralyn Pick, CRNP  The [facility] providers would like to encourage you to use [facility] to communicate with providers for non-urgent requests or questions.  Due to long hold times on the telephone, sending your provider a message by [facility] may be faster and more efficient way to get a response. Please allow 48 business hours for a response.  Please remember that this is for non-urgent requests/questions.  If you are age 64 or older, your body mass index should be between 23-30. Your Body mass index is 20.24 kg/m. If  this is out of the aforementioned range listed, please consider follow up with your Primary Care Provider.  If you are age 61 or younger, your body mass index should be between 19-25. Your Body mass index is 20.24 kg/m. If this is out of the aformentioned range listed, please consider follow up with your Primary Care Provider.

## 2020-08-14 NOTE — Progress Notes (Signed)
I agree with the above note, plan 

## 2020-09-11 IMAGING — CT CT CHEST W/ CM
1 series · 16 of 34 positions shown, 20 images · IV contrast (APPLIED)
Comparison: None.

CLINICAL DATA: COPD emphysema Chest tightness- 3 months Cancer-none
Surgery-none Current smoker

EXAM:
CT CHEST WITH CONTRAST
TECHNIQUE: Multidetector CT imaging of the chest was performed during
intravenous contrast administration.
CONTRAST:  75mL 5SLBV1-UZZ IOPAMIDOL (5SLBV1-UZZ) INJECTION 61%

[Series 2: chest w/cm · axial · 0.69mm/px · z∈[-349,-59]mm · 16 of 165 slices shown, 20 images]
[im 13/165  mediastinal]
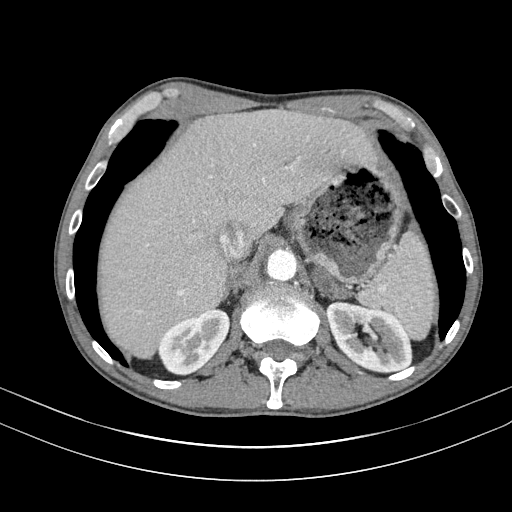
[im 13/165  lung]
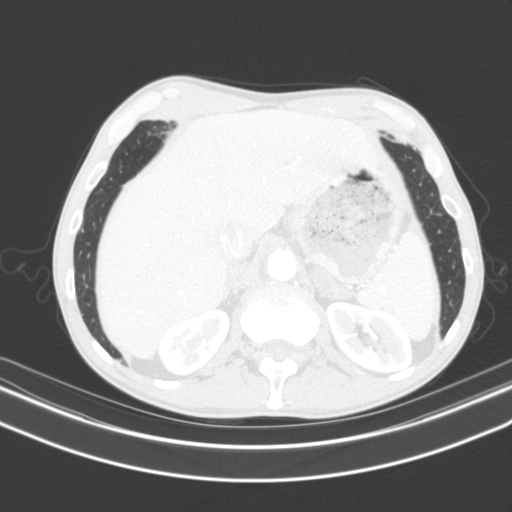
[im 25/165  lung]
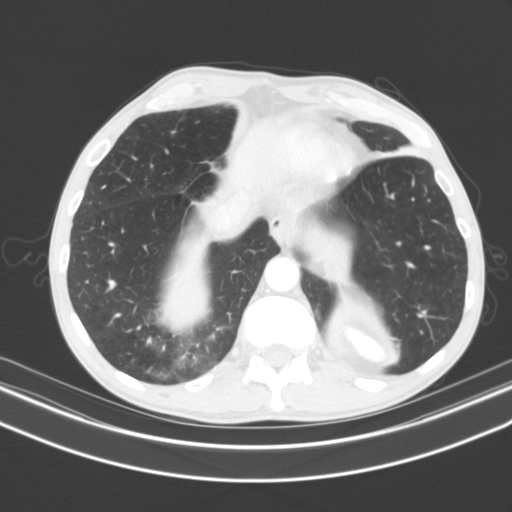
[im 33/165  lung]
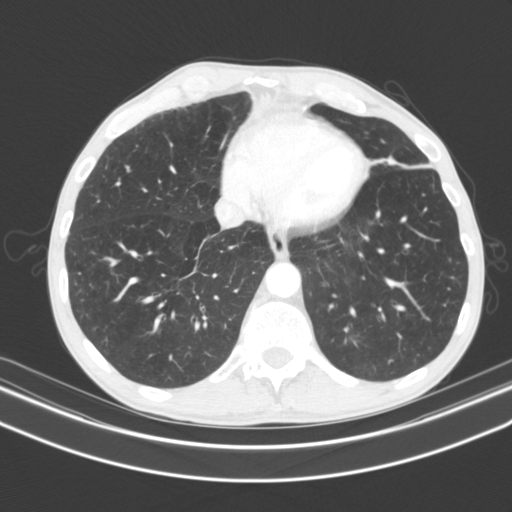
[im 43/165  lung]
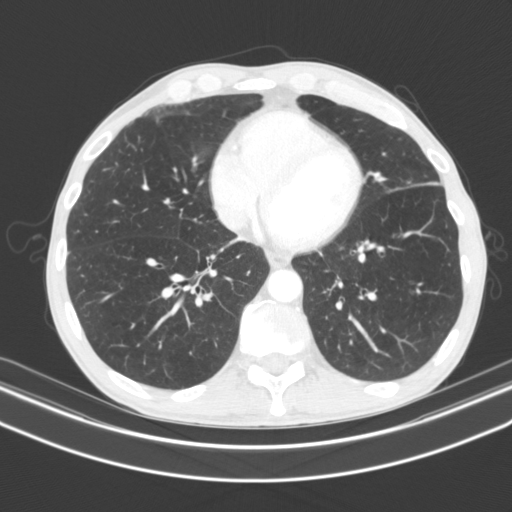
[im 55/165  mediastinal]
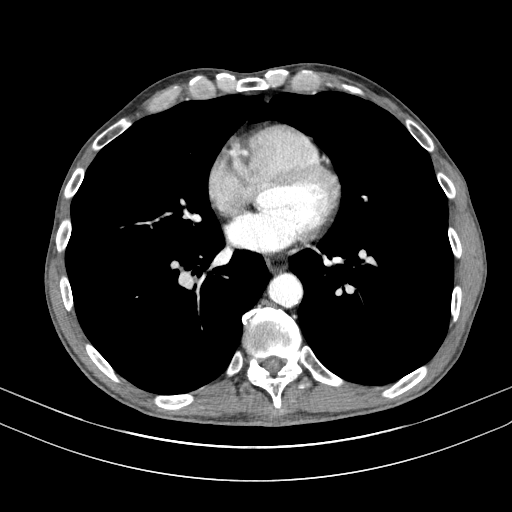
[im 55/165  lung]
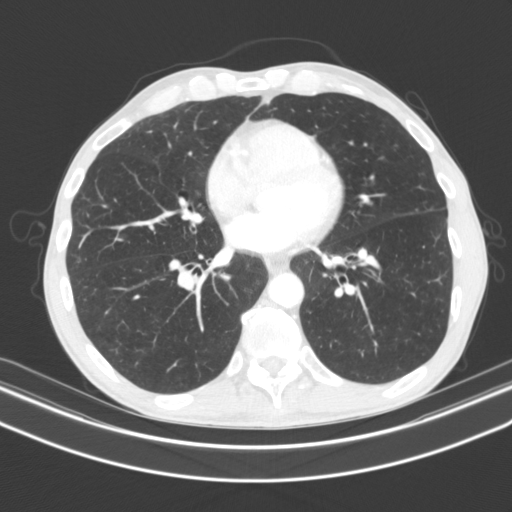
[im 66/165  lung]
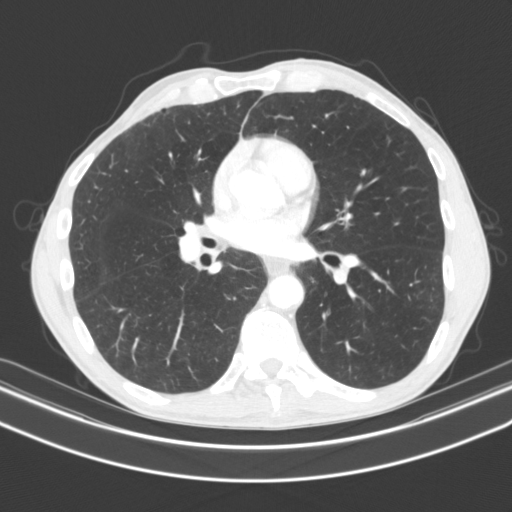
[im 73/165  lung]
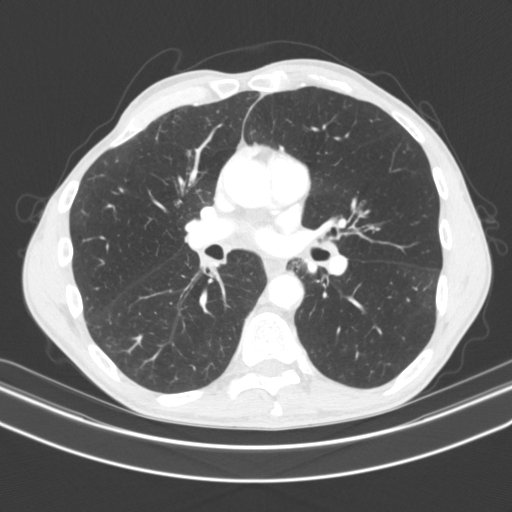
[im 79/165  lung]
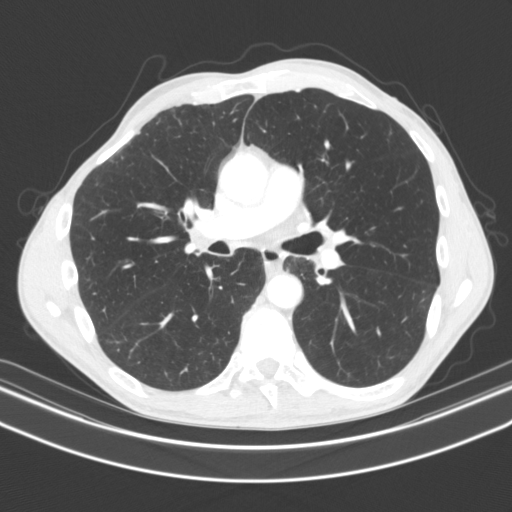
[im 88/165  mediastinal]
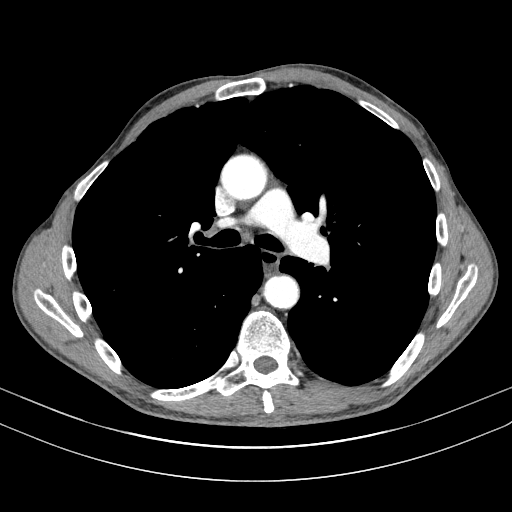
[im 88/165  lung]
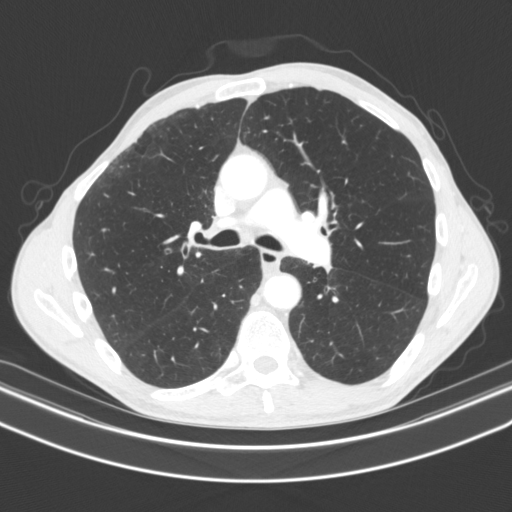
[im 98/165  lung]
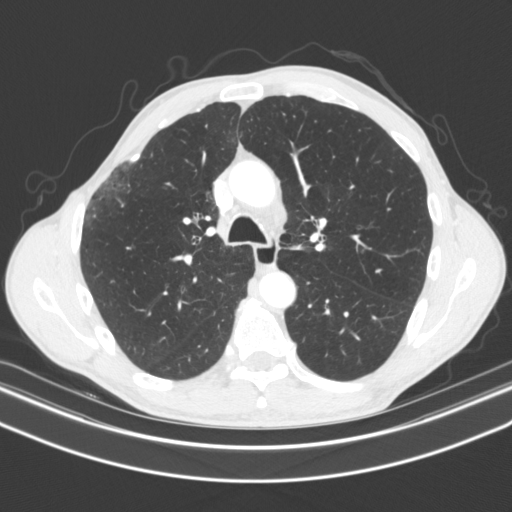
[im 104/165  lung]
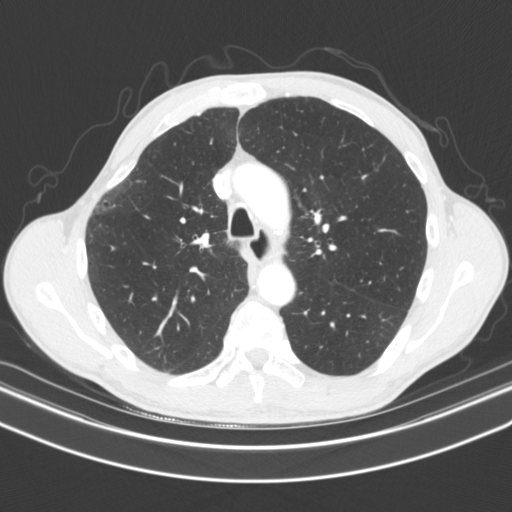
[im 116/165  lung]
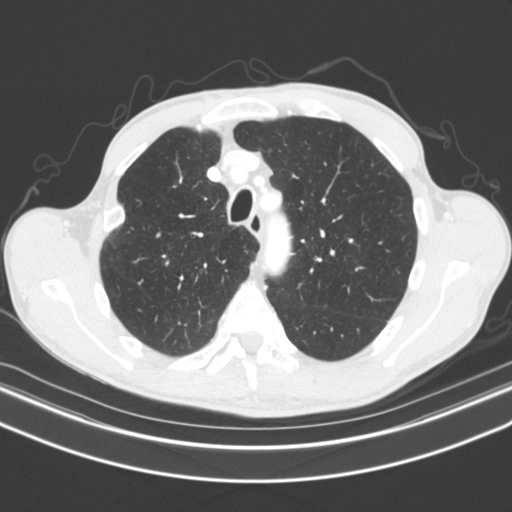
[im 128/165  mediastinal]
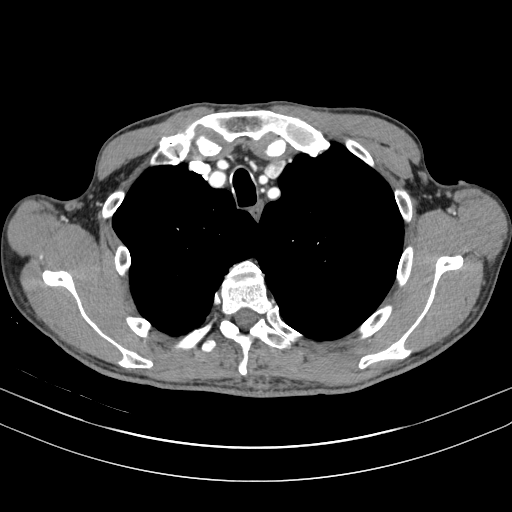
[im 128/165  lung]
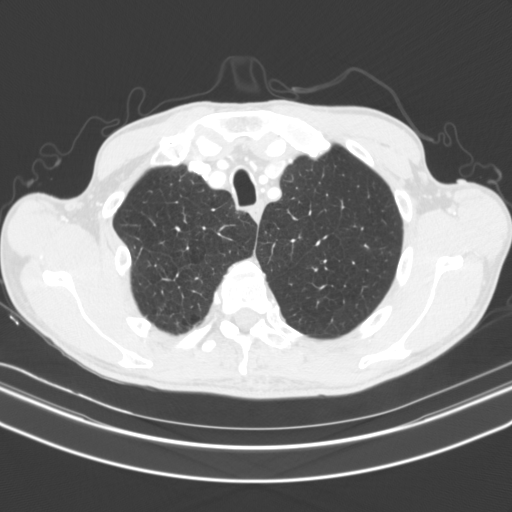
[im 134/165  lung]
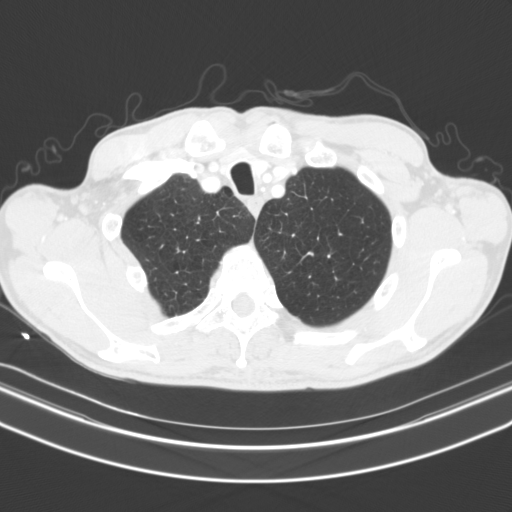
[im 146/165  lung]
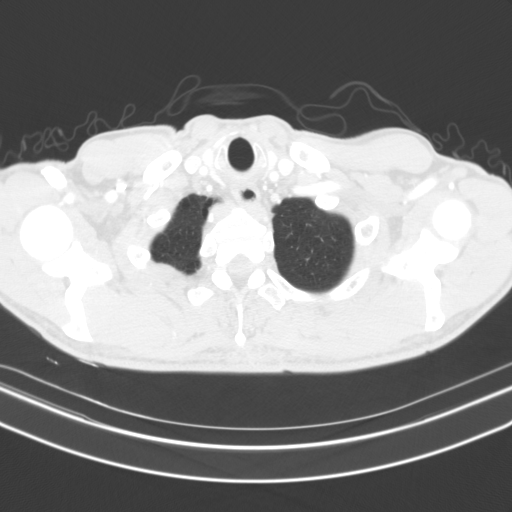
[im 158/165  lung]
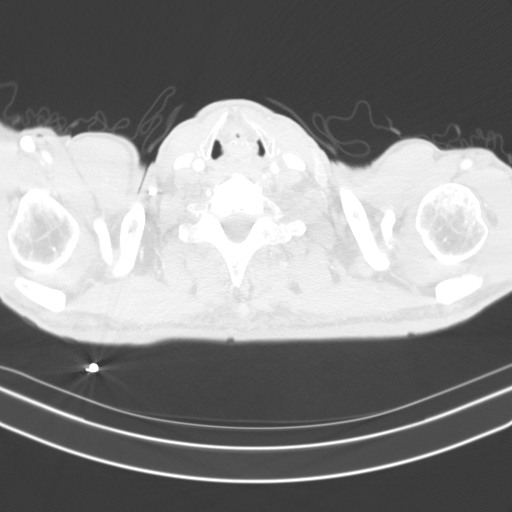

[16 of 34 positions shown; findings below may reference images not displayed]

FINDINGS: Cardiovascular: Heart size normal. No pericardial effusion. Plaque
in the descending thoracic aorta without aneurysm.

Mediastinum/Nodes: No hilar or mediastinal adenopathy.

Lungs/Pleura: No pleural effusion. No pneumothorax. No nodule or
infiltrate. Linear scarring or atelectasis in the posterior right
lower lobe. Subpleural ground-glass opacities in the right upper
lobe adjacent to right third rib deformity.

Upper Abdomen: No acute findings. Small cyst in the upper pole right
kidney.

Musculoskeletal: Spondylitic change in the lower cervical spine. No
fracture or worrisome bone lesion.
IMPRESSION: No acute findings.

Aortic Atherosclerosis (B44OU-170.0).

## 2020-09-13 ENCOUNTER — Other Ambulatory Visit: Payer: Self-pay | Admitting: Family Medicine

## 2020-09-13 DIAGNOSIS — F17200 Nicotine dependence, unspecified, uncomplicated: Secondary | ICD-10-CM

## 2020-09-13 DIAGNOSIS — R0602 Shortness of breath: Secondary | ICD-10-CM

## 2020-10-28 ENCOUNTER — Other Ambulatory Visit: Payer: Self-pay | Admitting: Family Medicine

## 2020-10-28 DIAGNOSIS — J449 Chronic obstructive pulmonary disease, unspecified: Secondary | ICD-10-CM

## 2020-10-31 ENCOUNTER — Other Ambulatory Visit: Payer: Self-pay

## 2020-10-31 DIAGNOSIS — R0602 Shortness of breath: Secondary | ICD-10-CM

## 2020-11-01 MED ORDER — ALBUTEROL SULFATE HFA 108 (90 BASE) MCG/ACT IN AERS: 2.0000 | INHALATION_SPRAY | Freq: Four times a day (QID) | RESPIRATORY_TRACT | 0 refills | Status: DC | PRN

## 2020-11-20 ENCOUNTER — Other Ambulatory Visit: Payer: Self-pay

## 2020-11-20 ENCOUNTER — Ambulatory Visit (AMBULATORY_SURGERY_CENTER): Payer: BC Managed Care – PPO | Admitting: Gastroenterology

## 2020-11-20 ENCOUNTER — Encounter: Payer: Self-pay | Admitting: Gastroenterology

## 2020-11-20 VITALS — BP 101/70 | HR 70 | Temp 97.8°F | Resp 18 | Ht 67.0 in | Wt 129.0 lb

## 2020-11-20 DIAGNOSIS — D128 Benign neoplasm of rectum: Secondary | ICD-10-CM | POA: Diagnosis not present

## 2020-11-20 DIAGNOSIS — D123 Benign neoplasm of transverse colon: Secondary | ICD-10-CM

## 2020-11-20 DIAGNOSIS — K625 Hemorrhage of anus and rectum: Secondary | ICD-10-CM | POA: Diagnosis not present

## 2020-11-20 DIAGNOSIS — D125 Benign neoplasm of sigmoid colon: Secondary | ICD-10-CM

## 2020-11-20 MED ORDER — SODIUM CHLORIDE 0.9 % IV SOLN: 500.0000 mL | Freq: Once | INTRAVENOUS | Status: DC

## 2020-11-20 NOTE — Progress Notes (Signed)
Report given to PACU, vss 

## 2020-11-20 NOTE — Patient Instructions (Signed)
Handout on polyps given to you today   YOU HAD AN ENDOSCOPIC PROCEDURE TODAY AT [facility]:   Refer to the procedure report that was given to you for any specific questions about what was found during the examination.  If the procedure report does not answer your questions, please call your gastroenterologist to clarify.  If you requested that your care partner not be given the details of your procedure findings, then the procedure report has been included in a sealed envelope for you to review at your convenience later.  YOU SHOULD EXPECT: Some feelings of bloating in the abdomen. Passage of more gas than usual.  Walking can help get rid of the air that was put into your GI tract during the procedure and reduce the bloating. If you had a lower endoscopy (such as a colonoscopy or flexible sigmoidoscopy) you may notice spotting of blood in your stool or on the toilet paper. If you underwent a bowel prep for your procedure, you may not have a normal bowel movement for a few days.  Please Note:  You might notice some irritation and congestion in your nose or some drainage.  This is from the oxygen used during your procedure.  There is no need for concern and it should clear up in a day or so.  SYMPTOMS TO REPORT IMMEDIATELY:  Following lower endoscopy (colonoscopy or flexible sigmoidoscopy):  Excessive amounts of blood in the stool  Significant tenderness or worsening of abdominal pains  Swelling of the abdomen that is new, acute  Fever of 100F or higher  For urgent or emergent issues, a gastroenterologist can be reached at any hour by calling [phone]. Do not use MyChart messaging for urgent concerns.    DIET:  We do recommend a small meal at first, but then you may proceed to your regular diet.  Drink plenty of fluids but you should avoid alcoholic beverages for 24 hours.  ACTIVITY:  You should plan to take it easy for the rest of today and you should NOT DRIVE or use  heavy machinery until tomorrow (because of the sedation medicines used during the test).    FOLLOW UP: Our staff will call the number listed on your records 48-72 hours following your procedure to check on you and address any questions or concerns that you may have regarding the information given to you following your procedure. If we do not reach you, we will leave a message.  We will attempt to reach you two times.  During this call, we will ask if you have developed any symptoms of COVID 19. If you develop any symptoms (ie: fever, flu-like symptoms, shortness of breath, cough etc.) before then, please call [phone].  If you test positive for Covid 19 in the 2 weeks post procedure, please call and report this information to [place].    If any biopsies were taken you will be contacted by phone or by letter within the next 1-3 weeks.  Please call us at [phone] if you have not heard about the biopsies in 3 weeks.    SIGNATURES/CONFIDENTIALITY: You and/or your care partner have signed paperwork which will be entered into your electronic medical record.  These signatures attest to the fact that that the information above on your After Visit Summary has been reviewed and is understood.  Full responsibility of the confidentiality of this discharge information lies with you and/or your care-partner.

## 2020-11-20 NOTE — Progress Notes (Signed)
Pt's states no medical or surgical changes since previsit or office visit. VS assessed by D.T 

## 2020-11-20 NOTE — Op Note (Signed)
[facility] Patient Name: Marcus Pennington Procedure Date: 11/20/2020 10:59 AM MRN: [id number] Endoscopist: Milus Banister , MD Age: 60 Referring MD:  Date of Birth: [birth year] Gender: Male Account #: [id number] Procedure:                Colonoscopy Indications:              Hematochezia Medicines:                Monitored Anesthesia Care Procedure:                Pre-Anesthesia Assessment:                           - Prior to the procedure, a History and Physical                            was performed, and patient medications and                            allergies were reviewed. The patient's tolerance of                            previous anesthesia was also reviewed. The risks                            and benefits of the procedure and the sedation                            options and risks were discussed with the patient.                            All questions were answered, and informed consent                            was obtained. Prior Anticoagulants: The patient has                            taken no previous anticoagulant or antiplatelet                            agents. ASA Grade Assessment: II - A patient with                            mild systemic disease. After reviewing the risks                            and benefits, the patient was deemed in                            satisfactory condition to undergo the procedure.                           After obtaining informed consent, the colonoscope  was passed under direct vision. Throughout the                            procedure, the patient's blood pressure, pulse, and                            oxygen saturations were monitored continuously. The                            Olympus PCF-H190DL [id number]) Colonoscope was                            introduced through the anus and advanced to the the                            cecum, identified by appendiceal orifice and                             ileocecal valve. The colonoscopy was performed                            without difficulty. The patient tolerated the                            procedure well. The quality of the bowel                            preparation was good. The ileocecal valve,                            appendiceal orifice, and rectum were photographed. Scope In: 11:12:53 AM Scope Out: 11:30:05 AM Scope Withdrawal Time: 0 hours 12 minutes 24 seconds  Total Procedure Duration: 0 hours 17 minutes 12 seconds  Findings:                 Three sessile polyps were found in the sigmoid                            colon and transverse colon. The polyps were 2 to 10                            mm in size (largest in the transverse segment).                            These polyps were removed with a cold snare.                            Resection was complete, but the polyp tissue was                            only partially retrieved. jar 1                           A 16 mm polyp was  found in the distal rectum. The                            polyp was semi-pedunculated. The polyp was removed                            with a hot snare. Resection and retrieval were                            complete. Following resection the site was labeled                            with two submucosal injections of Spot (carbon                            black). jar 2                           The exam was otherwise without abnormality on                            direct and retroflexion views. Complications:            No immediate complications. Estimated blood loss:                            None. Estimated Blood Loss:     Estimated blood loss: none. Impression:               - Three 2 to 3 mm polyps in the sigmoid colon and                            in the transverse colon, removed with a cold snare.                            Complete resection. Partial retrieval.                           - One 16  mm polyp in the distal rectum, removed                            with a hot snare. Resected and retrieved. The site                            was labled with Spot submucosal injection.                           - The examination was otherwise normal on direct                            and retroflexion views.                           The rectal polyp was very likely the source of his  minor intermittent rectal bleeding. Recommendation:           - Patient has a contact number available for                            emergencies. The signs and symptoms of potential                            delayed complications were discussed with the                            patient. Return to normal activities tomorrow.                            Written discharge instructions were provided to the                            patient.                           - Resume previous diet.                           - Continue present medications.                           - Await pathology results. Milus Banister, MD 11/20/2020 11:50:10 AM This report has been signed electronically.

## 2020-11-20 NOTE — Progress Notes (Signed)
HPI: This is a man with minor intermittent rectal bleeding   ROS: complete GI ROS as described in HPI, all other review negative.  Constitutional:  No unintentional weight loss   Past Medical History:  Diagnosis Date   COPD (chronic obstructive pulmonary disease) ([place])    Emphysema of lung ([place])    Seizures ([place]) 04/28/2018    History reviewed. No pertinent surgical history.  Current Outpatient Medications  Medication Sig Dispense Refill   albuterol (VENTOLIN HFA) 108 (90 Base) MCG/ACT inhaler Inhale 2 puffs into the lungs every 6 (six) hours as needed for wheezing or shortness of breath. 18 each 0   ANORO ELLIPTA 62.5-25 MCG/INH AEPB INHALE 1 PUFF BY MOUTH EVERY DAY 180 each 0   nicotine (NICODERM CQ - DOSED IN MG/24 HOURS) 21 mg/24hr patch PLACE 1 PATCH ONTO THE SKIN DAILY. 28 patch 0   pravastatin (PRAVACHOL) 40 MG tablet Take 1 tablet (40 mg total) by mouth at bedtime. 90 tablet 3   Current Facility-Administered Medications  Medication Dose Route Frequency Provider Last Rate Last Admin   0.9 %  sodium chloride infusion  500 mL Intravenous Once Milus Banister, MD       TDaP Durwin Reges) injection 0.5 mL  0.5 mL Intramuscular Once Zenia Resides, MD        Allergies as of 11/20/2020 - Review Complete 11/20/2020  Allergen Reaction Noted   Penicillins  09/12/2010   Sulfa antibiotics  09/12/2010    Family History  Problem Relation Age of Onset   Depression Mother    Arthritis Mother    Hypertension Mother    Cancer Mother    Diabetes Mother    Prostate cancer Father    Diabetes Sister     Social History   Socioeconomic History   Marital status: Divorced    Spouse name: Not on file   Number of children: 2   Years of education: GED   Highest education level: Not on file  Occupational History    Comment: [employer]  Tobacco Use   Smoking status: Every Day    Packs/day: 1.00    Years: 33.00    Pack years: 33.00    Types: Cigarettes   Smokeless tobacco:  Never  Substance and Sexual Activity   Alcohol use: No   Drug use: No   Sexual activity: Not Currently  Other Topics Concern   Not on file  Social History Narrative   Lives with daughter   Caffeine - Pepsi 2-3 daily   Social Determinants of Health   Financial Resource Strain: Not on file  Food Insecurity: Not on file  Transportation Needs: Not on file  Physical Activity: Not on file  Stress: Not on file  Social Connections: Not on file  Intimate Partner Violence: Not on file     Physical Exam: BP 116/67   Pulse 85   Temp 97.8 F (36.6 C) (Skin)   Ht '5\' 7"'$  (1.702 m)   Wt 129 lb (58.5 kg)   SpO2 95%   BMI 20.20 kg/m  Constitutional: generally well-appearing Psychiatric: alert and oriented x3 Lungs: CTA bilaterally Heart: no MCR  Assessment and plan: 60 y.o. male with minor intermittent rectal bleeding  Presumed hemorrhoidal. Screening colonoscopy today.  Care is appropriate for the ambulatory setting.  Owens Loffler, MD [facility] 11/20/2020, 11:01 AM

## 2020-11-20 NOTE — Progress Notes (Signed)
Called to room to assist during endoscopic procedure.  Patient ID and intended procedure confirmed with present staff. Received instructions for my participation in the procedure from the performing physician.  

## 2020-11-22 ENCOUNTER — Telehealth: Payer: Self-pay | Admitting: *Deleted

## 2020-11-22 NOTE — Telephone Encounter (Signed)
  Follow up Call-  Call back number 11/20/2020  Post procedure Call Back phone  # [phone]  Permission to leave phone message Yes  Some recent data might be hidden     Patient questions:  Do you have a fever, pain , or abdominal swelling? No. Pain Score  0 *  Have you tolerated food without any problems? Yes.    Have you been able to return to your normal activities? Yes.    Do you have any questions about your discharge instructions: Diet   No. Medications  No. Follow up visit  No.  Do you have questions or concerns about your Care? No.  Actions: * If pain score is 4 or above: No action needed, pain <4.  Have you developed a fever since your procedure? no  2.   Have you had an respiratory symptoms (SOB or cough) since your procedure? no  3.   Have you tested positive for COVID 19 since your procedure   4.   Have you had any family members/close contacts diagnosed with the COVID 19 since your procedure?  no   If yes to any of these questions please route to Joylene John, RN and Joella Prince, RN

## 2020-11-26 ENCOUNTER — Encounter: Payer: Self-pay | Admitting: Gastroenterology

## 2020-12-11 ENCOUNTER — Other Ambulatory Visit: Payer: Self-pay | Admitting: Family Medicine

## 2020-12-11 DIAGNOSIS — R0602 Shortness of breath: Secondary | ICD-10-CM

## 2021-01-01 ENCOUNTER — Other Ambulatory Visit: Payer: Self-pay | Admitting: Family Medicine

## 2021-01-01 DIAGNOSIS — J449 Chronic obstructive pulmonary disease, unspecified: Secondary | ICD-10-CM

## 2021-02-05 DIAGNOSIS — R0902 Hypoxemia: Secondary | ICD-10-CM | POA: Diagnosis not present

## 2021-02-05 DIAGNOSIS — E7800 Pure hypercholesterolemia, unspecified: Secondary | ICD-10-CM | POA: Diagnosis not present

## 2021-02-05 DIAGNOSIS — J441 Chronic obstructive pulmonary disease with (acute) exacerbation: Secondary | ICD-10-CM | POA: Diagnosis not present

## 2021-02-05 DIAGNOSIS — J439 Emphysema, unspecified: Secondary | ICD-10-CM | POA: Diagnosis not present

## 2021-02-05 DIAGNOSIS — R059 Cough, unspecified: Secondary | ICD-10-CM | POA: Diagnosis not present

## 2021-02-05 DIAGNOSIS — R6883 Chills (without fever): Secondary | ICD-10-CM | POA: Diagnosis not present

## 2021-02-05 DIAGNOSIS — F17210 Nicotine dependence, cigarettes, uncomplicated: Secondary | ICD-10-CM | POA: Diagnosis not present

## 2021-02-05 DIAGNOSIS — Z882 Allergy status to sulfonamides status: Secondary | ICD-10-CM | POA: Diagnosis not present

## 2021-02-05 DIAGNOSIS — Z880 Allergy status to penicillin: Secondary | ICD-10-CM | POA: Diagnosis not present

## 2021-02-05 DIAGNOSIS — Z20822 Contact with and (suspected) exposure to covid-19: Secondary | ICD-10-CM | POA: Diagnosis not present

## 2021-02-05 DIAGNOSIS — J449 Chronic obstructive pulmonary disease, unspecified: Secondary | ICD-10-CM | POA: Diagnosis not present

## 2021-02-05 DIAGNOSIS — Z79899 Other long term (current) drug therapy: Secondary | ICD-10-CM | POA: Diagnosis not present

## 2021-02-05 DIAGNOSIS — J101 Influenza due to other identified influenza virus with other respiratory manifestations: Secondary | ICD-10-CM | POA: Diagnosis not present

## 2021-02-05 DIAGNOSIS — Z716 Tobacco abuse counseling: Secondary | ICD-10-CM | POA: Diagnosis not present

## 2021-02-05 DIAGNOSIS — E785 Hyperlipidemia, unspecified: Secondary | ICD-10-CM | POA: Diagnosis not present

## 2021-02-17 ENCOUNTER — Encounter: Payer: Self-pay | Admitting: Family Medicine

## 2021-02-17 ENCOUNTER — Ambulatory Visit (INDEPENDENT_AMBULATORY_CARE_PROVIDER_SITE_OTHER): Payer: BC Managed Care – PPO | Admitting: Family Medicine

## 2021-02-17 ENCOUNTER — Other Ambulatory Visit: Payer: Self-pay

## 2021-02-17 VITALS — BP 149/71 | HR 89 | Ht 67.0 in | Wt 130.4 lb

## 2021-02-17 DIAGNOSIS — J441 Chronic obstructive pulmonary disease with (acute) exacerbation: Secondary | ICD-10-CM

## 2021-02-17 DIAGNOSIS — Z23 Encounter for immunization: Secondary | ICD-10-CM | POA: Diagnosis not present

## 2021-02-17 MED ORDER — PREDNISONE 50 MG PO TABS
50.0000 mg | ORAL_TABLET | Freq: Every day | ORAL | 0 refills | Status: DC
Start: 2021-02-17 — End: 2021-03-24

## 2021-02-17 MED ORDER — AZITHROMYCIN 500 MG PO TABS: 500.0000 mg | ORAL_TABLET | Freq: Every day | ORAL | 0 refills | Status: DC

## 2021-02-17 MED ORDER — ALBUTEROL SULFATE (2.5 MG/3ML) 0.083% IN NEBU: 2.5000 mg | INHALATION_SOLUTION | Freq: Four times a day (QID) | RESPIRATORY_TRACT | 6 refills | Status: DC | PRN

## 2021-02-17 NOTE — Patient Instructions (Addendum)
Stay quit smoking. Please do pulmonary rehab.   I sent in two prescriptions.   I hope the paperwork I filled out is sufficient. See me in early January to be sure you are ready to return to work.   You will need the new COVID booster when you come back. I also reordered your albuterol nebulizer.

## 2021-02-17 NOTE — Progress Notes (Signed)
    SUBJECTIVE:   CHIEF COMPLAINT / HPI:   FU hospitalization.  Patient hospitalized 11/30-12/2 with + influenza causing a COPD exacerbation.  "I thought I wasn't going to make it."  DCed with prednisone and Tamiflu, both of which he completed.  Also sent home with O2 - a new requirement for him.  He still feels considerably short of breath with minimal exertion.  "I have not smoked a cigarette" since being hospitalized.  He also has some short term disability paperwork for me to complete (I did)  He works around [employer], which is double dangerous with his O2.      OBJECTIVE:   BP (!) 149/71   Pulse 89   Ht 5\' 7"  (1.702 m)   Wt 130 lb 6.4 oz (59.1 kg)   SpO2 100% Comment: @2L   BMI 20.42 kg/m   Speaks in full sentences.  With O2 on. Lungs poor air movement with considerable wheezing. Cardiac RRR without m or g Ext no edema.    ASSESSMENT/PLAN:   COPD with acute exacerbation ([place]) Now two weeks out from the flu.  Still with considerable wheezing.  Will treat as a COPD exacerbation with another round of steroids and this time I will add azithro.  Supported his smoking cessation.  Recheck in 2-4 weeks.  We shall see if he has an ongoing need for home O2.  At [place], [facility] re[facility] was ordered.  Strongly encourged his participation.  He will need considerable work to get back to has previous level of function.       Zenia Resides, MD [facility]

## 2021-02-17 NOTE — Assessment & Plan Note (Signed)
Now two weeks out from the flu.  Still with considerable wheezing.  Will treat as a COPD exacerbation with another round of steroids and this time I will add azithro.  Supported his smoking cessation.  Recheck in 2-4 weeks.  We shall see if he has an ongoing need for home O2.  At [place], [place] rehab was ordered.  Strongly encourged his participation.  He will need considerable work to get back to has previous level of function.

## 2021-03-09 DIAGNOSIS — J101 Influenza due to other identified influenza virus with other respiratory manifestations: Secondary | ICD-10-CM | POA: Diagnosis not present

## 2021-03-10 DIAGNOSIS — J101 Influenza due to other identified influenza virus with other respiratory manifestations: Secondary | ICD-10-CM | POA: Diagnosis not present

## 2021-03-13 ENCOUNTER — Ambulatory Visit (INDEPENDENT_AMBULATORY_CARE_PROVIDER_SITE_OTHER): Payer: BC Managed Care – PPO

## 2021-03-13 DIAGNOSIS — Z0000 Encounter for general adult medical examination without abnormal findings: Secondary | ICD-10-CM | POA: Diagnosis not present

## 2021-03-13 NOTE — Progress Notes (Signed)
Subjective:   Marcus Pennington is a 61 y.o. male who presents for Medicare Annual/Subsequent preventive examination.  The patient consented to a virtual visit. Patient consented to have virtual visit and was identified by name and date of birth. Method of visit: Telephone  Encounter participants: Patient: Marcus Pennington - located at Home Nurse/Provider: Dorna Bloom - located at [facility] Others (if applicable): NA  Review of Systems: Defer to PCP.   Cardiac Risk Factors include: smoking/ tobacco exposure;advanced age (>57men, >79 women);male gender  Objective:   Vitals: There were no vitals taken for this visit.  There is no height or weight on file to calculate BMI.  Advanced Directives 03/13/2021 10/16/2019 05/09/2018  Does Patient Have a Medical Advance Directive? No No No  Would patient like information on creating a medical advance directive? Yes (MAU/Ambulatory/Procedural Areas - Information given) No - Patient declined No - Patient declined   Tobacco Social History   Tobacco Use  Smoking Status Some Days   Years: 33.00   Types: Cigarettes  Smokeless Tobacco Never  Tobacco Comments   Reports on 4 cigs since 02/07/2021.     Ready to quit: Yes Counseling given: Yes Tobacco comments: Reports on 4 cigs since 02/07/2021.  Clinical Intake:  Pre-visit preparation completed: Yes  Pain Score: 0-No pain  How often do you need to have someone help you when you read instructions, pamphlets, or other written materials from your doctor or pharmacy?: 2 - Rarely What is the last grade level you completed in school?: High School  Past Medical History:  Diagnosis Date   COPD (chronic obstructive pulmonary disease) ([place])    Emphysema of lung ([place])    Seizures ([place]) 04/28/2018   No past surgical history on file. Family History  Problem Relation Age of Onset   Depression Mother    Arthritis Mother    Hypertension Mother    Cancer Mother    Diabetes Mother    Prostate cancer  Father    Diabetes Sister    Social History   Socioeconomic History   Marital status: Divorced    Spouse name: Not on file   Number of children: 2   Years of education: GED   Highest education level: Not on file  Occupational History    Comment: [employer]  Tobacco Use   Smoking status: Some Days    Years: 33.00    Types: Cigarettes   Smokeless tobacco: Never   Tobacco comments:    Reports on 4 cigs since 02/07/2021.  Vaping Use   Vaping Use: Never used  Substance and Sexual Activity   Alcohol use: No   Drug use: No   Sexual activity: Not Currently  Other Topics Concern   Not on file  Social History Narrative   Patient lives with his daughter and 4 grandchildren in [place].    Patient has not smoked since [place] in December 2022.   Patient is on home O2 2.5L.   Patient is currently on FMLA for his job.    Patient enjoys watching TV and spending time with family.    Social Determinants of Health   Financial Resource Strain: Low Risk    Difficulty of Paying Living Expenses: Not very hard  Food Insecurity: No Food Insecurity   Worried About Charity fundraiser in the Last Year: Never true   Ran Out of Food in the Last Year: Never true  Transportation Needs: No Transportation Needs   Lack of Transportation (Medical):  No   Lack of Transportation (Non-Medical): No  Physical Activity: Inactive   Days of Exercise per Week: 0 days   Minutes of Exercise per Session: 0 min  Stress: Stress Concern Present   Feeling of Stress : To some extent  Social Connections: Socially Isolated   Frequency of Communication with Friends and Family: More than three times a week   Frequency of Social Gatherings with Friends and Family: More than three times a week   Attends Religious Services: Never   Marine scientist or Organizations: No   Attends Archivist Meetings: Never   Marital Status: Divorced   Outpatient Encounter Medications as of 03/13/2021  Medication Sig    albuterol (PROVENTIL) (2.5 MG/3ML) 0.083% nebulizer solution Take 3 mLs (2.5 mg total) by nebulization every 6 (six) hours as needed for wheezing or shortness of breath.   albuterol (VENTOLIN HFA) 108 (90 Base) MCG/ACT inhaler TAKE 2 PUFFS BY MOUTH EVERY 6 HOURS AS NEEDED FOR WHEEZE OR SHORTNESS OF BREATH   ANORO ELLIPTA 62.5-25 MCG/ACT AEPB INHALE 1 PUFF BY MOUTH EVERY DAY   nicotine (NICODERM CQ - DOSED IN MG/24 HOURS) 21 mg/24hr patch PLACE 1 PATCH ONTO THE SKIN DAILY.   pravastatin (PRAVACHOL) 40 MG tablet Take 1 tablet (40 mg total) by mouth at bedtime.   azithromycin (ZITHROMAX) 500 MG tablet Take 1 tablet (500 mg total) by mouth daily. (Patient not taking: Reported on 03/13/2021)   predniSONE (DELTASONE) 50 MG tablet Take 1 tablet (50 mg total) by mouth daily with breakfast. (Patient not taking: Reported on 03/13/2021)   Facility-Administered Encounter Medications as of 03/13/2021  Medication   TDaP (BOOSTRIX) injection 0.5 mL   Activities of Daily Living In your present state of health, do you have any difficulty performing the following activities: 03/13/2021  Hearing? N  Vision? N  Difficulty concentrating or making decisions? N  Walking or climbing stairs? Y  Dressing or bathing? N  Doing errands, shopping? N  Preparing Food and eating ? N  Using the Toilet? N  In the past six months, have you accidently leaked urine? N  Do you have problems with loss of bowel control? N  Managing your Medications? N  Managing your Finances? N  Housekeeping or managing your Housekeeping? N  Some recent data might be hidden   Patient Care Team: Zenia Resides, MD as PCP - General (Family Medicine)   Assessment:   This is a routine wellness examination for Marcus Pennington.  Exercise Activities and Dietary recommendations Current Exercise Habits: The patient does not participate in regular exercise at present, Exercise limited by: respiratory conditions(s)   Goals      Quit Smoking     Patient  reports ~ 4 cigs since 02/07/21. Patient reports he has been using the nicotine patches.        Fall Risk Fall Risk  03/13/2021 05/09/2018 12/11/2015  Falls in the past year? 0 0 No  Number falls in past yr: 0 - -  Injury with Fall? 0 - -  Risk for fall due to : No Fall Risks - -  Follow up Falls prevention discussed - -   Patient reports normal gait and balance. Patient does not use assistive devices to ambulate.   Is the patient's home free of loose throw rugs in walkways, pet beds, electrical cords, etc?   yes      Grab bars in the bathroom? yes      Handrails on the stairs?  yes      Adequate lighting?   yes  Depression Screen PHQ 2/9 Scores 03/13/2021 02/17/2021 07/08/2020 11/09/2019  PHQ - 2 Score 1 1 1  0  PHQ- 9 Score 6 6 3 4    Cognitive Function 6CIT Screen 03/13/2021  What Year? 0 points  What month? 0 points  What time? 0 points  Count back from 20 0 points  Months in reverse 0 points  Repeat phrase 0 points  Total Score 0   Immunization History  Administered Date(s) Administered   Hepatitis A 02/25/1999, 09/04/1999   Hepatitis B 02/25/1999, 04/01/1999, 09/04/1999   Influenza,inj,Quad PF,6+ Mos 12/11/2015, 11/09/2019, 02/17/2021   PFIZER Comirnaty(Gray Top)Covid-19 Tri-Sucrose Vaccine 07/08/2020   PFIZER(Purple Top)SARS-COV-2 Vaccination 10/16/2019, 11/09/2019   PNEUMOCOCCAL CONJUGATE-20 07/08/2020   Td 02/25/1999   Tdap 09/12/2010   Qualifies for Shingles Vaccine? Yes   Shingrix Completed: No, Education has been provided regarding the importance of this vaccine. Advised may receive this vaccine at local pharmacy or Health Dept. Aware to provide a copy of the vaccination record if obtained from local pharmacy or Health Dept. Verbalized acceptance and understanding.   Screening Tests Health Maintenance  Topic Date Due   Zoster Vaccines- Shingrix (1 of 2) Never done   COVID-19 Vaccine (4 - Booster for Pfizer series) 09/02/2020   TETANUS/TDAP  09/11/2020   COLONOSCOPY  (Pts 45-49yrs Insurance coverage will need to be confirmed)  11/21/2023   Pneumococcal Vaccine 7-78 Years old  Completed   INFLUENZA VACCINE  Completed   Hepatitis C Screening  Completed   HIV Screening  Completed   HPV VACCINES  Aged Out   Cancer Screenings: Lung: Low Dose CT Chest recommended if Age 65-80 years, 30 pack-year currently smoking OR have quit w/in 15years. Patient does qualify. Colorectal: UTD- Next due 2025.  Additional Screenings: Hepatitis C Screening: Completed  HIV Screening: Completed   Plan:  PCP apt scheduled for 03/24/2021. Pulmonology apt scheduled 04/01/2021. Plan to get Covid Booster at PCP apt.  Fill out advance directive packet.   I have personally reviewed and noted the following in the patients chart:   Medical and social history Use of alcohol, tobacco or illicit drugs  Current medications and supplements Functional ability and status Nutritional status Physical activity Advanced directives List of other physicians Hospitalizations, surgeries, and ER visits in previous 12 months Vitals Screenings to include cognitive, depression, and falls Referrals and appointments  In addition, I have reviewed and discussed with patient certain preventive protocols, quality metrics, and best practice recommendations. A written personalized care plan for preventive services as well as general preventive health recommendations were provided to patient.  Dorna Bloom, [place]  03/18/2021

## 2021-03-16 DIAGNOSIS — R0902 Hypoxemia: Secondary | ICD-10-CM | POA: Diagnosis not present

## 2021-03-16 DIAGNOSIS — Z79899 Other long term (current) drug therapy: Secondary | ICD-10-CM | POA: Diagnosis not present

## 2021-03-16 DIAGNOSIS — R0600 Dyspnea, unspecified: Secondary | ICD-10-CM | POA: Diagnosis not present

## 2021-03-16 DIAGNOSIS — R0602 Shortness of breath: Secondary | ICD-10-CM | POA: Diagnosis not present

## 2021-03-16 DIAGNOSIS — R918 Other nonspecific abnormal finding of lung field: Secondary | ICD-10-CM | POA: Diagnosis not present

## 2021-03-16 DIAGNOSIS — J441 Chronic obstructive pulmonary disease with (acute) exacerbation: Secondary | ICD-10-CM | POA: Diagnosis not present

## 2021-03-16 DIAGNOSIS — R0989 Other specified symptoms and signs involving the circulatory and respiratory systems: Secondary | ICD-10-CM | POA: Diagnosis not present

## 2021-03-16 DIAGNOSIS — R519 Headache, unspecified: Secondary | ICD-10-CM | POA: Diagnosis not present

## 2021-03-16 DIAGNOSIS — F17210 Nicotine dependence, cigarettes, uncomplicated: Secondary | ICD-10-CM | POA: Diagnosis not present

## 2021-03-16 DIAGNOSIS — Z20822 Contact with and (suspected) exposure to covid-19: Secondary | ICD-10-CM | POA: Diagnosis not present

## 2021-03-16 DIAGNOSIS — R062 Wheezing: Secondary | ICD-10-CM | POA: Diagnosis not present

## 2021-03-16 DIAGNOSIS — R000 Tachycardia, unspecified: Secondary | ICD-10-CM | POA: Diagnosis not present

## 2021-03-18 NOTE — Patient Instructions (Addendum)
You spoke to Marcus Pennington, [place] over the phone for your annual wellness visit.  We discussed goals:   Goals      Quit Smoking     Patient reports ~ 4 cigs since 02/07/21. Patient reports he has been using the nicotine patches.        We also discussed recommended health maintenance. As discussed, you are due for:  Health Maintenance  Topic Date Due   Zoster Vaccines- Shingrix (1 of 2) Never done   COVID-19 Vaccine (4 - Booster for Pfizer series) 09/02/2020   TETANUS/TDAP  09/11/2020   COLONOSCOPY (Pts 45-1yrs Insurance coverage will need to be confirmed)  11/21/2023   Pneumococcal Vaccine 19-32 Years old  Completed   INFLUENZA VACCINE  Completed   Hepatitis C Screening  Completed   HIV Screening  Completed   HPV VACCINES  Aged Out   PCP apt scheduled for 03/24/2021. Pulmonology apt scheduled 04/01/2021. Plan to get Covid Booster at PCP apt.  Fill out advance directive packet.   We also discussed smoking cessation. Keep up the good work! 1-800-QUIT-LINE.  Preventive Care 41-15 Years Old, Male Preventive care refers to lifestyle choices and visits with your health care provider that can promote health and wellness. Preventive care visits are also called wellness exams. What can I expect for my preventive care visit? Counseling During your preventive care visit, your health care provider may ask about your: Medical history, including: Past medical problems. Family medical history. Current health, including: Emotional well-being. Home life and relationship well-being. Sexual activity. Lifestyle, including: Alcohol, nicotine or tobacco, and drug use. Access to firearms. Diet, exercise, and sleep habits. Safety issues such as seatbelt and bike helmet use. Sunscreen use. Work and work Statistician. Physical exam Your health care provider will check your: Height and weight. These may be used to calculate your BMI (body mass index). BMI is a measurement that tells if you  are at a healthy weight. Waist circumference. This measures the distance around your waistline. This measurement also tells if you are at a healthy weight and may help predict your risk of certain diseases, such as type 2 diabetes and high blood pressure. Heart rate and blood pressure. Body temperature. Skin for abnormal spots. What immunizations do I need? Vaccines are usually given at various ages, according to a schedule. Your health care provider will recommend vaccines for you based on your age, medical history, and lifestyle or other factors, such as travel or where you work. What tests do I need? Screening Your health care provider may recommend screening tests for certain conditions. This may include: Lipid and cholesterol levels. Diabetes screening. This is done by checking your blood sugar (glucose) after you have not eaten for a while (fasting). Hepatitis B test. Hepatitis C test. HIV (human immunodeficiency virus) test. STI (sexually transmitted infection) testing, if you are at risk. Lung cancer screening. Prostate cancer screening. Colorectal cancer screening. Talk with your health care provider about your test results, treatment options, and if necessary, the need for more tests. Follow these instructions at home: Eating and drinking  Eat a diet that includes fresh fruits and vegetables, whole grains, lean protein, and low-fat dairy products. Take vitamin and mineral supplements as recommended by your health care provider. Do not drink alcohol if your health care provider tells you not to drink. If you drink alcohol: Limit how much you have to 0-2 drinks a day. Know how much alcohol is in your drink. In the [place] one drink  equals one 12 oz bottle of beer (355 mL), one 5 oz glass of wine (148 mL), or one 1 oz glass of hard liquor (44 mL). Lifestyle Brush your teeth every morning and night with fluoride toothpaste. Floss one time each day. Exercise for at least 30 minutes  5 or more days each week. Do not use any products that contain nicotine or tobacco. These products include cigarettes, chewing tobacco, and vaping devices, such as e-cigarettes. If you need help quitting, ask your health care provider. Do not use drugs. If you are sexually active, practice safe sex. Use a condom or other form of protection to prevent STIs. Take aspirin only as told by your health care provider. Make sure that you understand how much to take and what form to take. Work with your health care provider to find out whether it is safe and beneficial for you to take aspirin daily. Find healthy ways to manage stress, such as: Meditation, yoga, or listening to music. Journaling. Talking to a trusted person. Spending time with friends and family. Minimize exposure to UV radiation to reduce your risk of skin cancer. Safety Always wear your seat belt while driving or riding in a vehicle. Do not drive: If you have been drinking alcohol. Do not ride with someone who has been drinking. When you are tired or distracted. While texting. If you have been using any mind-altering substances or drugs. Wear a helmet and other protective equipment during sports activities. If you have firearms in your house, make sure you follow all gun safety procedures. What's next? Go to your health care provider once a year for an annual wellness visit. Ask your health care provider how often you should have your eyes and teeth checked. Stay up to date on all vaccines. This information is not intended to replace advice given to you by your health care provider. Make sure you discuss any questions you have with your health care provider. Document Revised: 08/21/2020 Document Reviewed: 08/21/2020 Elsevier Patient Education  2022 [place] of Quitting Smoking Quitting smoking is a physical and mental challenge. You will face cravings, withdrawal symptoms, and temptation. Before  quitting, work with your health care provider to make a plan that can help you manage quitting. Preparation can help you quit and keep you from giving in. How to manage lifestyle changes Managing stress Stress can make you want to smoke, and wanting to smoke may cause stress. It is important to find ways to manage your stress. You might try some of the following: Practice relaxation techniques. Breathe slowly and deeply, in through your nose and out through your mouth. Listen to music. Soak in a bath or take a shower. Imagine a peaceful place or vacation. Get some support. Talk with family or friends about your stress. Join a support group. Talk with a counselor or therapist. Get some physical activity. Go for a walk, run, or bike ride. Play a favorite sport. Practice yoga.  Medicines Talk with your health care provider about medicines that might help you deal with cravings and make quitting easier for you. Relationships Social situations can be difficult when you are quitting smoking. To manage this, you can: Avoid parties and other social situations where people might be smoking. Avoid alcohol. Leave right away if you have the urge to smoke. Explain to your family and friends that you are quitting smoking. Ask for support and let them know you might be a bit grumpy. Plan activities where  smoking is not an option. General instructions Be aware that many people gain weight after they quit smoking. However, not everyone does. To keep from gaining weight, have a plan in place before you quit and stick to the plan after you quit. Your plan should include: Having healthy snacks. When you have a craving, it may help to: Eat popcorn, carrots, celery, or other cut vegetables. Chew sugar-free gum. Changing how you eat. Eat small portion sizes at meals. Eat 4-6 small meals throughout the day instead of 1-2 large meals a day. Be mindful when you eat. Do not watch television or do other  things that might distract you as you eat. Exercising regularly. Make time to exercise each day. If you do not have time for a long workout, do short bouts of exercise for 5-10 minutes several times a day. Do some form of strengthening exercise, such as weight lifting. Do some exercise that gets your heart beating and causes you to breathe deeply, such as walking fast, running, swimming, or biking. This is very important. Drinking plenty of water or other low-calorie or no-calorie drinks. Drink 6-8 glasses of water daily.  How to recognize withdrawal symptoms Your body and mind may experience discomfort as you try to get used to not having nicotine in your system. These effects are called withdrawal symptoms. They may include: Feeling hungrier than normal. Having trouble concentrating. Feeling irritable or restless. Having trouble sleeping. Feeling depressed. Craving a cigarette. To manage withdrawal symptoms: Avoid places, people, and activities that trigger your cravings. Remember why you want to quit. Get plenty of sleep. Avoid coffee and other caffeinated drinks. These may worsen some of your symptoms. These symptoms may surprise you. But be assured that they are normal to have when quitting smoking. How to manage cravings Come up with a plan for how to deal with your cravings. The plan should include the following: A definition of the specific situation you want to deal with. An alternative action you will take. A clear idea for how this action will help. The name of someone who might help you with this. Cravings usually last for 5-10 minutes. Consider taking the following actions to help you with your plan to deal with cravings: Keep your mouth busy. Chew sugar-free gum. Suck on hard candies or a straw. Brush your teeth. Keep your hands and body busy. Change to a different activity right away. Squeeze or play with a ball. Do an activity or a hobby, such as making bead  jewelry, practicing needlepoint, or working with wood. Mix up your normal routine. Take a short exercise break. Go for a quick walk or run up and down stairs. Focus on doing something kind or helpful for someone else. Call a friend or family member to talk during a craving. Join a support group. Contact a quitline. Where to find support To get help or find a support group: Call the [facility]'s Smoking Quitline: 1-800-QUIT NOW 949 221 0031) Visit the website of the Substance Abuse and [place]: ktimeonline.com Text QUIT to SmokefreeTXT: 384665 Where to find more information Visit these websites to find more information on quitting smoking: [place]: www.smokefree.gov American Lung Association: www.lung.org American Cancer Society: www.cancer.org Centers for Disease Control and Prevention: http://www.wolf.info/ American Heart Association: www.heart.org Contact a health care provider if: You want to change your plan for quitting. The medicines you are taking are not helping. Your eating feels out of control or you cannot sleep. Get help right away  if: You feel depressed or become very anxious. Summary Quitting smoking is a physical and mental challenge. You will face cravings, withdrawal symptoms, and temptation to smoke again. Preparation can help you as you go through these challenges. Try different techniques to manage stress, handle social situations, and prevent weight gain. You can deal with cravings by keeping your mouth busy (such as by chewing gum), keeping your hands and body busy, calling family or friends, or contacting a quitline for people who want to quit smoking. You can deal with withdrawal symptoms by avoiding places where people smoke, getting plenty of rest, and avoiding drinks with caffeine. This information is not intended to replace advice given to you by your health care provider. Make sure you discuss any questions  you have with your health care provider. Document Revised: 11/01/2020 Document Reviewed: 12/13/2018 Elsevier Patient Education  2022 [employer].   Our clinic's number is [phone]. Please call with questions or concerns about what we discussed today.

## 2021-03-18 NOTE — Progress Notes (Signed)
I have reviewed this visit and agree with the documentation.   

## 2021-03-24 ENCOUNTER — Encounter: Payer: Self-pay | Admitting: Family Medicine

## 2021-03-24 ENCOUNTER — Other Ambulatory Visit: Payer: Self-pay

## 2021-03-24 ENCOUNTER — Ambulatory Visit (INDEPENDENT_AMBULATORY_CARE_PROVIDER_SITE_OTHER): Payer: BC Managed Care – PPO | Admitting: Family Medicine

## 2021-03-24 DIAGNOSIS — J449 Chronic obstructive pulmonary disease, unspecified: Secondary | ICD-10-CM

## 2021-03-24 DIAGNOSIS — Z609 Problem related to social environment, unspecified: Secondary | ICD-10-CM | POA: Diagnosis not present

## 2021-03-24 DIAGNOSIS — F17200 Nicotine dependence, unspecified, uncomplicated: Secondary | ICD-10-CM

## 2021-03-24 NOTE — Patient Instructions (Signed)
I will give you a letter for work that says that you will be on oxygen indefinitely.  I don't know how they will react. You cannot apply for disability while you still have a job.  If you lose your job, you should apply right away.  Yes, I do consider you disabled.   If you lose your drug coverage, make an appointment quickly with our pharmacist (Dr. Valentina Lucks or Dr. Claiborne Billings.)  They can help you apply to the drug company for assistance.  You really should not go without that [place]. Great job in cutting back smoking.  I hope you make the final step and quit altogether.

## 2021-03-25 ENCOUNTER — Encounter: Payer: Self-pay | Admitting: Family Medicine

## 2021-03-25 NOTE — Assessment & Plan Note (Addendum)
Provided a work note.  He is concerned that presenting the note, which he requested, will get him fired.  Will need pharm help to apply for company assistance to stay on his anoro.   Also answered questions about applying for disability.

## 2021-03-25 NOTE — Assessment & Plan Note (Signed)
Nicely cut down.  Now if he can just quit altogether.

## 2021-03-25 NOTE — Assessment & Plan Note (Signed)
Severe.  Now stable.  Nice job in decreasing his tobacco.

## 2021-03-25 NOTE — Progress Notes (Signed)
° ° °  SUBJECTIVE:   CHIEF COMPLAINT / HPI:   Fu er visit for COVID and COPD. Patient has done poorly since he was admitted 02/05/21.  He has severe COPD and that illness tipped him over the edge to oxygen requiring which now appears to be his chronic baseline.  He had a COPD exacerbation and was seen in the ER on 03/16/21.  Treated with prednisone and antibiotics.  Improving to his O2 requiring baseline.  Gets dyspnea with minimal exertion.  Cannot walk up a flight of steps.  He has been off work since November.  He doubts that he will be allowed to work with chronic oxygen therapy.  I had initially written him out of work until this week.  He needs a follow up letter.  He is concerned he will lose his job and benefits due to his now O2 dependent COPD.  He asked about SS disability.  I gave him a numb nail sketch of the problem.  Tobacco abuse.  Cut down considerably to only one to two cigarettes per day.    OBJECTIVE:   BP (!) 143/82    Pulse (!) 56    Ht 5\' 7"  (1.702 m)    Wt 132 lb 3.2 oz (60 kg)    SpO2 100% Comment: @2L    BMI 20.71 kg/m   Lungs  scattered wheeze.  Good air movement.   Cardiac RRR without m or g Ext no edema.  ASSESSMENT/PLAN:   High risk social situation Provided a work note.  He is concerned that presenting the note, which he requested, will get him fired.  Will need pharm help to apply for company assistance to stay on his anoro.   Also answered questions about applying for disability.  COPD (chronic obstructive pulmonary disease) Severe.  Now stable.  Nice job in decreasing his tobacco.    [place] cut down.  Now if he can just quit altogether.       Zenia Resides, MD [place]

## 2021-04-06 ENCOUNTER — Other Ambulatory Visit: Payer: Self-pay | Admitting: Family Medicine

## 2021-04-06 DIAGNOSIS — J449 Chronic obstructive pulmonary disease, unspecified: Secondary | ICD-10-CM

## 2021-04-08 ENCOUNTER — Other Ambulatory Visit: Payer: Self-pay

## 2021-04-08 ENCOUNTER — Telehealth: Payer: Self-pay

## 2021-04-08 ENCOUNTER — Other Ambulatory Visit: Payer: Self-pay | Admitting: Family Medicine

## 2021-04-08 DIAGNOSIS — J449 Chronic obstructive pulmonary disease, unspecified: Secondary | ICD-10-CM

## 2021-04-08 DIAGNOSIS — J441 Chronic obstructive pulmonary disease with (acute) exacerbation: Secondary | ICD-10-CM

## 2021-04-08 DIAGNOSIS — R0602 Shortness of breath: Secondary | ICD-10-CM

## 2021-04-08 MED ORDER — ALBUTEROL SULFATE (2.5 MG/3ML) 0.083% IN NEBU: 2.5000 mg | INHALATION_SOLUTION | Freq: Four times a day (QID) | RESPIRATORY_TRACT | 6 refills | Status: DC | PRN

## 2021-04-08 MED ORDER — ALBUTEROL SULFATE HFA 108 (90 BASE) MCG/ACT IN AERS: INHALATION_SPRAY | RESPIRATORY_TRACT | 6 refills | Status: DC

## 2021-04-08 NOTE — Telephone Encounter (Signed)
Coralyn Mark patients Case Manager calls nurse line requesting PCP advice on a decongestant. Coralyn Mark reports the patient has been congested since finishing prednisone. Coralyn Mark reports congestion is the only symptom aside from his chronic cough.   Will forward to PCP for advisement.

## 2021-04-09 ENCOUNTER — Other Ambulatory Visit: Payer: Self-pay | Admitting: Family Medicine

## 2021-04-09 DIAGNOSIS — J441 Chronic obstructive pulmonary disease with (acute) exacerbation: Secondary | ICD-10-CM

## 2021-04-09 DIAGNOSIS — J449 Chronic obstructive pulmonary disease, unspecified: Secondary | ICD-10-CM

## 2021-04-09 MED ORDER — IPRATROPIUM-ALBUTEROL 0.5-2.5 (3) MG/3ML IN SOLN: 3.0000 mL | RESPIRATORY_TRACT | 12 refills | Status: DC | PRN

## 2021-04-09 MED ORDER — LEVALBUTEROL HCL 1.25 MG/3ML IN NEBU: 1.2500 mg | INHALATION_SOLUTION | RESPIRATORY_TRACT | 12 refills | Status: DC | PRN

## 2021-04-09 NOTE — Telephone Encounter (Signed)
Duonebs that I just ordered should help congestion.

## 2021-04-09 NOTE — Telephone Encounter (Signed)
Rx duonebs since albuterol is on back order.

## 2021-04-10 DIAGNOSIS — J101 Influenza due to other identified influenza virus with other respiratory manifestations: Secondary | ICD-10-CM | POA: Diagnosis not present

## 2021-04-18 ENCOUNTER — Telehealth: Payer: Self-pay

## 2021-04-18 ENCOUNTER — Telehealth: Payer: Self-pay | Admitting: Family Medicine

## 2021-04-18 ENCOUNTER — Other Ambulatory Visit (HOSPITAL_COMMUNITY): Payer: Self-pay

## 2021-04-18 NOTE — Telephone Encounter (Signed)
Spoke with Dr. Valentina Lucks. Medication samples have been labeled and set aside in med room. 4 boxes of Anoro inhalers.   Called daughter and scheduled follow up pharmacy visit with Dr. Valentina Lucks on Monday 2/13 at 1030.  Talbot Grumbling, RN

## 2021-04-18 NOTE — Telephone Encounter (Signed)
Patient's ex-wife dropped off disability form to be completed. Last DOS was 03/24/21. Placed in [employer].

## 2021-04-18 NOTE — Telephone Encounter (Signed)
Patient's daughter calls nurse line regarding cost of Anoro inhaler. 3 month supply with insurance will cost around $200. Patient is unable to afford this. They are requesting an alternative to be sent to the pharmacy.   Will forward to PCP and pharmacy team for further advisement.   Talbot Grumbling, RN

## 2021-04-18 NOTE — Telephone Encounter (Signed)
Clinical info completed on Claim form.  Place form in Dr. Lowella Bandy box for completion.  Domnique Vanegas Zimmerman Rumple, CMA

## 2021-04-18 NOTE — Telephone Encounter (Signed)
Thank you for the wonderful pharmacy support.

## 2021-04-21 ENCOUNTER — Other Ambulatory Visit: Payer: Self-pay

## 2021-04-21 ENCOUNTER — Ambulatory Visit (INDEPENDENT_AMBULATORY_CARE_PROVIDER_SITE_OTHER): Payer: BC Managed Care – PPO | Admitting: Pharmacist

## 2021-04-21 ENCOUNTER — Encounter: Payer: Self-pay | Admitting: Pharmacist

## 2021-04-21 DIAGNOSIS — F17200 Nicotine dependence, unspecified, uncomplicated: Secondary | ICD-10-CM | POA: Diagnosis not present

## 2021-04-21 DIAGNOSIS — J449 Chronic obstructive pulmonary disease, unspecified: Secondary | ICD-10-CM | POA: Diagnosis not present

## 2021-04-21 MED ORDER — TRELEGY ELLIPTA 200-62.5-25 MCG/ACT IN AEPB: 1.0000 | INHALATION_SPRAY | Freq: Every day | RESPIRATORY_TRACT | 0 refills | Status: DC

## 2021-04-21 MED ORDER — IPRATROPIUM-ALBUTEROL 0.5-2.5 (3) MG/3ML IN SOLN: 3.0000 mL | RESPIRATORY_TRACT | 12 refills | Status: DC | PRN

## 2021-04-21 NOTE — Telephone Encounter (Signed)
Form completed and placed in RN box

## 2021-04-21 NOTE — Assessment & Plan Note (Signed)
Chronic tobacco abuse with potential for environmental exposure to toxins in patient who is currently attempting to quit smoking.  He appears highly motivated at this time. Patient confidence in quitting smoking for a week by the end of the month is 9.75/10. Patient verbalized a need to quit smoking. -continued nicotine patch 21mg /day - Follow-up by phone in ~ 2 weeks to assess breathing and smoking status.

## 2021-04-21 NOTE — Progress Notes (Signed)
Reviewed: I agree with Dr. Koval's documentation and management. 

## 2021-04-21 NOTE — Patient Instructions (Addendum)
It was nice to see you today.  -Start the Trelegy Inhaler 1 inhalation everyday. Remember to rinse out your mouth after use. -Continue albuterol nebulizer and inhaler as needed. We have resent the prescription for Duoneb to the pharmacy. -Continue Nicotine 21mg  patch and continue to cut down on cigarettes.  We will follow up with you at the end of the month via phone.

## 2021-04-21 NOTE — Assessment & Plan Note (Signed)
Very Severe COPD - Patient has been experiencing COPD since 2012 and taking Albuterol nebulizer and inhaler, and Anoro Ellipta (umeclidinium-vilanterol) 62.5-25mcg, 1 puff daily. [facility] based on CAT score and exacerbations in the last year. Patient with good inhaler technique. Patient medication adherence appears optimal.  -Discontinued (umeclidinium-vilanterol) 62.5-25mcg, 1 puff daily. -Initiated Trelegy (fluticasone-umeclidinium-vilanterol) 200-62.5-25mcg, 1 puff daily. -Educated patient on purpose, proper use, potential adverse effects including risk of esophageal candidiasis and need to rinse mouth after each use.

## 2021-04-21 NOTE — Progress Notes (Signed)
° °  S:    Patient arrives in decent spirits. Presents for medication optimization and tobacco cessation. Patient was referred and last seen by Primary Care Provider, Dr. Andria Frames, on 03/24/2021. At previous appointment, patient stated he cannot afford his Anoro Elipta out of pocket cost but believes he could afford a 3 month supply for $200 next month.   Patient reports breathing has been suboptimal since previous exacerbation on 03/17/2021. Patient is out of breath when climbing a set of stairs. He endorses cough and increased clear sputum production. Patient requires continuous oxygen therapy.   Patient has smoked everyday since he was 61 years old. He is down to 3 cig/day. His brand of choice is [employer]. He has tried patches and gum in the past and is currently on the 21mg /day patch. He does not want to try the gum again. Triggers to smoke are nighttime routine as well as life stressors. Wants to stop smoking to prevent future COPD exacerbations.  Medication adherence reported optimal Current COPD medications: Albuterol nebulizer and inhaler as needed. Patient endorses nebulizer use at 3 times a day and inhaler use as 4 times a day. Anoro Ellipta (umeclidinium-vilanterol) 62.5-25mcg, 1 puff daily. Patient has not started Duoneb (ipratropium/albuterol) 0.5-2.5mg /52mL as the pharmacy was out. Patient exacerbation hx: two exacerbations within the past two months that required hospitalizations.  O: Physical Exam Neurological:     Mental Status: He is alert.  Psychiatric:        Mood and Affect: Mood normal.        Behavior: Behavior normal.    Review of Systems  Respiratory:  Positive for cough, sputum production and shortness of breath. Negative for wheezing.   All other systems reviewed and are negative.  Vitals:   04/21/21 1047  BP: 122/81  Pulse: 77  SpO2: 99%   CAT score = 34  A/P: Very Severe COPD - Patient has been experiencing COPD since 2012 and taking Albuterol nebulizer  and inhaler, and Anoro Ellipta (umeclidinium-vilanterol) 62.5-25mcg, 1 puff daily. [facility] based on CAT score and exacerbations in the last year. Patient with good inhaler technique. Patient medication adherence appears optimal.  -Discontinued (umeclidinium-vilanterol) 62.5-25mcg, 1 puff daily. -Initiated Trelegy (fluticasone-umeclidinium-vilanterol) 200-62.5-25mcg, 1 puff daily. -Educated patient on purpose, proper use, potential adverse effects including risk of esophageal candidiasis and need to rinse mouth after each use.    Chronic tobacco abuse with potential for environmental exposure to toxins in patient who is currently attempting to quit smoking.  He appears highly motivated at this time. Patient confidence in quitting smoking for a week by the end of the month is 9.75/10. Patient verbalized a need to quit smoking. -continued nicotine patch 21mg /day - Follow-up by phone in ~ 2 weeks to assess breathing and smoking status.   Pt verbalized understanding of results and education.    Written pt instructions provided.    Total time in face to face counseling 45 minutes.  Patient seen with Gala Murdoch, PharmD Candidate.

## 2021-04-23 NOTE — Telephone Encounter (Signed)
Form faxed to provided number. Copy made and placed in batch scanning.   Talbot Grumbling, RN

## 2021-05-08 DIAGNOSIS — J101 Influenza due to other identified influenza virus with other respiratory manifestations: Secondary | ICD-10-CM | POA: Diagnosis not present

## 2021-05-13 ENCOUNTER — Telehealth: Payer: Self-pay | Admitting: Pharmacist

## 2021-05-13 DIAGNOSIS — F17200 Nicotine dependence, unspecified, uncomplicated: Secondary | ICD-10-CM

## 2021-05-13 NOTE — Telephone Encounter (Signed)
Patient contacted for follow/up of tobacco intake cessation attempt.  ? ?Since last contact patient reports that he has completely quit smoking.  States it has not been easy but he reports complete abstinence.  ?QUIT X 3 Weeks! ? ?Medications currently being used; Nicotine patch - '21mg'$  ? ?Patient denies any significant side effects from tobacco cessation therapy.  ? ?Rates IMPORTANCE of quitting tobacco on 1-10 scale of 10. ?Rates CONFIDENCE of quitting tobacco on 1-10 scale of 9.5. ? ?Most common triggers to use tobacco include; stress  ? ?Motivation to quit: breathing ? ?- Continue on '21mg'$  nicotine patch until follow-up in 1 month.  ?Total time with patient call and documentation of interaction: 13 minutes. ? ?F/U Phone call planned: 1 month.  ?Taper patch at that time.  ? ?

## 2021-05-13 NOTE — Telephone Encounter (Signed)
Noted: Time for celebration.   ?

## 2021-05-13 NOTE — Assessment & Plan Note (Signed)
Patient contacted for follow/up of tobacco intake cessation attempt.  ? ?Since last contact patient reports that he has completely quit smoking.  States it has not been easy but he reports complete abstinence.  ?QUIT X 3 Weeks! ? ?Medications currently being used; Nicotine patch - '21mg'$  ? ?Patient denies any significant side effects from tobacco cessation therapy.  ? ?Rates IMPORTANCE of quitting tobacco on 1-10 scale of 10. ?Rates CONFIDENCE of quitting tobacco on 1-10 scale of 9.5. ? ?Most common triggers to use tobacco include; stress  ?

## 2021-06-08 DIAGNOSIS — J101 Influenza due to other identified influenza virus with other respiratory manifestations: Secondary | ICD-10-CM | POA: Diagnosis not present

## 2021-06-09 DIAGNOSIS — J439 Emphysema, unspecified: Secondary | ICD-10-CM | POA: Diagnosis not present

## 2021-06-10 ENCOUNTER — Telehealth: Payer: Self-pay | Admitting: Pharmacist

## 2021-06-10 MED ORDER — NICOTINE 14 MG/24HR TD PT24: 14.0000 mg | MEDICATED_PATCH | Freq: Every day | TRANSDERMAL | 1 refills | Status: DC

## 2021-06-10 NOTE — Telephone Encounter (Signed)
-----   Message from Leavy Cella, [place] sent at 05/14/2021  9:01 AM EST ----- ?Regarding: Quit X 3 weeks - Time to taper '21mg'$  patch? ? ? ?

## 2021-06-10 NOTE — Telephone Encounter (Signed)
Noted and agree. 

## 2021-06-10 NOTE — Telephone Encounter (Signed)
Patient contacted for follow/up of  tobacco cessation attempt.  ? ?Since last contact patient reports continued abstinence from smoking.  ?Medications currently being used;  ?Nicotine patch - '21mg'$  ?Patient denies any significant side effects from tobacco cessation therapy.  ? ?Most common triggers to use tobacco include; first AM and after evening meal - eating candy helps.   ? ?Motivation to quit: breathing.  ? ?Agreed to plan of reducing from '21mg'$  to '14mg'$  nicotine patch when current supply of '21mg'$  is used (` 10 more patches).   ?- '14mg'$  patch prescription provided.  ? ?Total time with patient call and documentation of interaction: 7 minutes. ? ?F/U Phone call planned: 1 month ? ? ?

## 2021-06-16 DIAGNOSIS — J439 Emphysema, unspecified: Secondary | ICD-10-CM | POA: Diagnosis not present

## 2021-06-17 ENCOUNTER — Other Ambulatory Visit: Payer: Self-pay

## 2021-06-17 ENCOUNTER — Telehealth: Payer: Self-pay

## 2021-06-17 DIAGNOSIS — J449 Chronic obstructive pulmonary disease, unspecified: Secondary | ICD-10-CM

## 2021-06-17 MED ORDER — TRELEGY ELLIPTA 200-62.5-25 MCG/ACT IN AEPB: 1.0000 | INHALATION_SPRAY | Freq: Every day | RESPIRATORY_TRACT | 6 refills | Status: DC

## 2021-06-17 NOTE — Telephone Encounter (Signed)
Spoke with Candice.  She will stop by office to pick up letter.  Letter place up front. ?

## 2021-06-17 NOTE — Telephone Encounter (Signed)
Letter created as requested. ?

## 2021-06-17 NOTE — Telephone Encounter (Signed)
Marcus Pennington calls nurse line requesting a letter from PCP stating patient has been deemed disabled.  ? ?Marcus Pennington reports she needs this to help him apply for disability and other services.  ? ?Will forward to PCP.  ?

## 2021-06-18 ENCOUNTER — Telehealth: Payer: Self-pay | Admitting: Family Medicine

## 2021-06-18 NOTE — Telephone Encounter (Signed)
Ex-wife dropped off form at front desk for Short Term Disability.  Verified that patient section of form has been completed.  Last DOS/WCC with PCP was 03/24/21.  Placed form in green team folder to be completed by clinical staff.  Creig Hines  ?

## 2021-06-20 NOTE — Telephone Encounter (Signed)
Clinical info completed on Short Term Disability form.  Placed form in Dr. Lowella Bandy box for completion.   ? ?When form is completed, please route note to "RN Team" and place in wall pocket in front office. ? ? ?Ottis Stain, CMA  ?

## 2021-06-23 DIAGNOSIS — J439 Emphysema, unspecified: Secondary | ICD-10-CM | POA: Diagnosis not present

## 2021-06-25 DIAGNOSIS — J439 Emphysema, unspecified: Secondary | ICD-10-CM | POA: Diagnosis not present

## 2021-06-25 MED ORDER — TRELEGY ELLIPTA 200-62.5-25 MCG/ACT IN AEPB: 1.0000 | INHALATION_SPRAY | Freq: Every day | RESPIRATORY_TRACT | 6 refills | Status: DC

## 2021-06-25 NOTE — Addendum Note (Signed)
Addended by: Valerie Roys on: 06/25/2021 04:04 PM ? ? Modules accepted: Orders ? ?

## 2021-06-25 NOTE — Telephone Encounter (Signed)
Patient called and states that pharmacy never received the refill on his trelegy.  It looks like the sample order was refilled instead of the regular script.  Medication reordered and sent to pharmacy.  They will contact patient for an appt.  Charise Leinbach,CMA ? ?

## 2021-06-26 NOTE — Telephone Encounter (Signed)
Form completed and placed in RN box

## 2021-06-27 NOTE — Telephone Encounter (Signed)
Patient made aware that form has been completed and faxed to employer.  Original placed in scan pile.  Jalaysha Skilton,CMA ? ?

## 2021-06-30 DIAGNOSIS — J439 Emphysema, unspecified: Secondary | ICD-10-CM | POA: Diagnosis not present

## 2021-07-01 ENCOUNTER — Telehealth: Payer: Self-pay | Admitting: *Deleted

## 2021-07-01 DIAGNOSIS — J441 Chronic obstructive pulmonary disease with (acute) exacerbation: Secondary | ICD-10-CM

## 2021-07-01 MED ORDER — ALBUTEROL SULFATE (2.5 MG/3ML) 0.083% IN NEBU: INHALATION_SOLUTION | RESPIRATORY_TRACT | 6 refills | Status: AC

## 2021-07-01 NOTE — Telephone Encounter (Signed)
Received fax requesting a 90 day prescription request for patients iprat-albut 0.5-3(2.5) MG/3ML. Please send if appropriate.  It looks like he had refills for a year.Marcus Pennington, CMA ? ?

## 2021-07-01 NOTE — Telephone Encounter (Signed)
Electronic refill sent ?

## 2021-07-02 DIAGNOSIS — J439 Emphysema, unspecified: Secondary | ICD-10-CM | POA: Diagnosis not present

## 2021-07-06 ENCOUNTER — Other Ambulatory Visit: Payer: Self-pay | Admitting: Family Medicine

## 2021-07-06 DIAGNOSIS — Z13220 Encounter for screening for lipoid disorders: Secondary | ICD-10-CM

## 2021-07-07 DIAGNOSIS — J439 Emphysema, unspecified: Secondary | ICD-10-CM | POA: Diagnosis not present

## 2021-07-08 DIAGNOSIS — J101 Influenza due to other identified influenza virus with other respiratory manifestations: Secondary | ICD-10-CM | POA: Diagnosis not present

## 2021-07-09 ENCOUNTER — Telehealth: Payer: Self-pay | Admitting: Pharmacist

## 2021-07-09 DIAGNOSIS — J439 Emphysema, unspecified: Secondary | ICD-10-CM | POA: Diagnosis not present

## 2021-07-09 DIAGNOSIS — F17200 Nicotine dependence, unspecified, uncomplicated: Secondary | ICD-10-CM

## 2021-07-09 MED ORDER — NICOTINE 7 MG/24HR TD PT24: 7.0000 mg | MEDICATED_PATCH | Freq: Every day | TRANSDERMAL | 0 refills | Status: DC

## 2021-07-09 NOTE — Assessment & Plan Note (Signed)
Patient contacted for follow/up of tobacco cessation - February 2023. ? ?Since last contact patient reports continued abstinence from smoking.  ? ?Medications currently being used;  ?Nicotine patch - '14mg'$  (18 patches remaining) ?Patient denies any significant side effects from tobacco cessation therapy.  ? ?Rates IMPORTANCE of quitting tobacco remains high.  ?Motivation to remain quit: breathing ? ?Patient is using "DuoNeb" ~ 2 times daily  ?He is also using his albuterol rescue routinely.  ?He states that he believe the Trelegy inhaler is helping more than the Anoro inhaler he used in the past.  We reviewed keys to inhaler use including rinsing after use.  ? ?Patient asked if he needed to see a pulmonologist after he completed the pulmonary rehab program.  I told him that seeing a lung specialist would likely be determined by his satisfaction with his level of function with current regimen.  I deferred to PCP, Dr. Andria Frames for pulmonologist plan.   ? ?I congratulated him on his continued abstinence from smoking.   ?We agreed that an additional step down from '14mg'$  to '7mg'$  patches seemed appropriate for him to try when the '14mg'$  patch supply runs out in ~ 3 weeks.  ?New Nicotine '7mg'$  patch prescription sent to pharmacy.  ?

## 2021-07-09 NOTE — Telephone Encounter (Signed)
Noted and agree. 

## 2021-07-09 NOTE — Addendum Note (Signed)
Addended by: Leavy Cella on: 07/09/2021 10:43 AM ? ? Modules accepted: Orders ? ?

## 2021-07-09 NOTE — Telephone Encounter (Signed)
Patient contacted for follow/up of tobacco cessation - February 2023. ? ?Since last contact patient reports continued abstinence from smoking.  ? ?Medications currently being used;  ?Nicotine patch - '14mg'$  (18 patches remaining) ?Patient denies any significant side effects from tobacco cessation therapy.  ? ?Rates IMPORTANCE of quitting tobacco remains high.  ?Motivation to remain quit: breathing ? ?Patient is using "DuoNeb" ~ 2 times daily  ?He is also using his albuterol rescue routinely.  ?He states that he believe the Trelegy inhaler is helping more than the Anoro inhaler he used in the past.  We reviewed keys to inhaler use including rinsing after use.  ? ?Patient asked if he needed to see a pulmonologist after he completed the pulmonary rehab program.  I told him that seeing a lung specialist would likely be determined by his satisfaction with his level of function with current regimen.  I deferred to PCP, Dr. Andria Frames for pulmonologist plan.   ? ?I congratulated him on his continued abstinence from smoking.   ?We agreed that an additional step down from '14mg'$  to '7mg'$  patches seemed appropriate for him to try when the '14mg'$  patch supply runs out in ~ 3 weeks.  ?New Nicotine '7mg'$  patch prescription sent to pharmacy.  ? ? ?Total time with patient call and documentation of interaction: 13 minutes. ? ?F/U Phone call planned: 3-4 weeks.  ? ? ?

## 2021-07-14 DIAGNOSIS — J439 Emphysema, unspecified: Secondary | ICD-10-CM | POA: Diagnosis not present

## 2021-07-16 DIAGNOSIS — J439 Emphysema, unspecified: Secondary | ICD-10-CM | POA: Diagnosis not present

## 2021-07-21 DIAGNOSIS — J439 Emphysema, unspecified: Secondary | ICD-10-CM | POA: Diagnosis not present

## 2021-07-23 DIAGNOSIS — J439 Emphysema, unspecified: Secondary | ICD-10-CM | POA: Diagnosis not present

## 2021-07-28 DIAGNOSIS — J439 Emphysema, unspecified: Secondary | ICD-10-CM | POA: Diagnosis not present

## 2021-07-30 DIAGNOSIS — J439 Emphysema, unspecified: Secondary | ICD-10-CM | POA: Diagnosis not present

## 2021-07-31 ENCOUNTER — Telehealth: Payer: Self-pay | Admitting: Pharmacist

## 2021-07-31 DIAGNOSIS — F17200 Nicotine dependence, unspecified, uncomplicated: Secondary | ICD-10-CM

## 2021-07-31 NOTE — Telephone Encounter (Signed)
I called patient.  He was on 3l/min O2 at pulm rehab.  They decreased to 2l/min and pulse ox remained good - but he felt more SOB.  Also more cough.  Using treligy regularly.  Two suggestions: OK to increase to 3l/min during exercise and/or Use his albuterol MDI immediately prior to pulm rehab exercise session.   Patient will try.   Also congratulated on his smoking cessation.

## 2021-07-31 NOTE — Telephone Encounter (Signed)
Patient contacted for follow/up of successful tobacco cessation attempt.   Since last contact patient reports continued abstinence.  He states he continues to go to pulmonary rehab and is scheduled to continue this through August 9th.    Medications currently being used;  Nicotine patch - 7 mg  Patient denies any significant side effects from tobacco cessation therapy.  We agreed that he should attempt to finish current supply of '7mg'$  patches ~ 2-3 week supply and then stop.  He felt confident that he could stay abstinent from smoking - not relapse when stopping patches.   Most common triggers to use tobacco include; first AM and after meals   Motivation to quit: BREATHING.  States he was told to decrease his oxygen from 3L to 2L and he notes worsened breathing since that change.  I shared with him that I would share that concern with Dr. Andria Frames.  At this time, he does not have a PCP follow-up visit scheduled.  I shared that I would ask Dr. Andria Frames to give him a call    Total time with patient call and documentation of interaction: 22 minutes.  F/U Phone call planned: 2-3 weeks

## 2021-07-31 NOTE — Assessment & Plan Note (Signed)
Patient contacted for follow/up of successful tobacco cessation attempt.   Since last contact patient reports continued abstinence.  He states he continues to go to pulmonary rehab and is scheduled to continue this through August 9th.    Medications currently being used;  Nicotine patch - 7 mg  Patient denies any significant side effects from tobacco cessation therapy.  We agreed that he should attempt to finish current supply of '7mg'$  patches ~ 2-3 week supply and then stop.  He felt confident that he could stay abstinent from smoking - not relapse when stopping patches.   Most common triggers to use tobacco include; first AM and after meals   Motivation to quit: BREATHING.  States he was told to decrease his oxygen from 3L to 2L and he notes worsened breathing since that change.  I shared with him that I would share that concern with Dr. Andria Frames.  At this time, he does not have a PCP follow-up visit scheduled.  I shared that I would ask Dr. Andria Frames to give him a call

## 2021-07-31 NOTE — Telephone Encounter (Signed)
-----   Message from Leavy Cella, [place] sent at 07/09/2021 10:40 AM EDT ----- Regarding: Nicotine patch tapered from 14 to '7mg'$  patches Remain quit?

## 2021-08-08 DIAGNOSIS — J101 Influenza due to other identified influenza virus with other respiratory manifestations: Secondary | ICD-10-CM | POA: Diagnosis not present

## 2021-08-12 ENCOUNTER — Encounter: Payer: Self-pay | Admitting: *Deleted

## 2021-08-18 ENCOUNTER — Telehealth: Payer: Self-pay

## 2021-08-18 DIAGNOSIS — J439 Emphysema, unspecified: Secondary | ICD-10-CM | POA: Diagnosis not present

## 2021-08-18 DIAGNOSIS — F17200 Nicotine dependence, unspecified, uncomplicated: Secondary | ICD-10-CM

## 2021-08-18 DIAGNOSIS — J449 Chronic obstructive pulmonary disease, unspecified: Secondary | ICD-10-CM

## 2021-08-18 NOTE — Telephone Encounter (Signed)
Marcus Pennington with [facility] calls nurse line in regards to rehab visit today.   Marcus Pennington reports the patient was very SOB on arrival. Marcus Pennington reports "visibly struggling to breathe."   Marcus Pennington reports they increases patients O2 to 4L and he improved. O2 sats were at 100% on 4L.   Marcus Pennington reports diminished lung sounds and wheezing.   Marcus Pennington reports her attending physician advised ED visit, however patient declined. Marcus Pennington reports he stated he would be willing to go to UC if PCP agreed.   I called patient and he was at home on 4L resting. Patient was able to carry on conversation, however apparent he is SOB.  Patient advised to go to UC/ED. Patient agreed with plan and planning to call a ride.

## 2021-08-18 NOTE — Telephone Encounter (Signed)
Noted and agree. 

## 2021-08-19 MED ORDER — PREDNISONE 20 MG PO TABS: 40.0000 mg | ORAL_TABLET | Freq: Every day | ORAL | 0 refills | Status: DC

## 2021-08-19 NOTE — Telephone Encounter (Signed)
-----   Message from Leavy Cella, [place] sent at 07/31/2021 10:30 AM EDT ----- Regarding: OFF patches (was using '7mg'$  patch 5/25)

## 2021-08-19 NOTE — Telephone Encounter (Signed)
Called.  No fever or sick contacts.  Yes, SOB.  Will Rx with pred burst. Also, I am a bit surprised that I have not previously referred him to pulm for second opinion.  Clearly, his COPD is severe enough that I would welcome the help.  Referral entered.

## 2021-08-19 NOTE — Addendum Note (Signed)
Addended by: Zenia Resides on: 08/19/2021 11:18 AM   Modules accepted: Orders

## 2021-08-19 NOTE — Telephone Encounter (Signed)
Patient contacted for follow/up of tobacco cessation efforts.    Since last contact patient reports "I had a rough week last week"   He stated that he was more congested and short of breath.  However, he reported that is is modestly better this week.   Pulmonary rehab has adjusted his oxygen up to 4L/min which he states has helped.  He states his first AM oxygen saturations are 85-88 but reports this is due to his oxygen coming OFF while he sleeps.  He states that once his oxygen is ON he has Ox sats that are consistently 90-92 and recently they have been higher 93-95 (likely due to oxygen increase).   He shared that the pulmonary rehab folks encouraged him to go to urgent care for a visit yesterday.  He did not due to the cost.  He said that someone thought and he agreed that he may need a prednisone "burst".     I shared that I would forward my note to Dr. Andria Frames and perhaps Dr. Andria Frames would be able to call him today.  He desired that call back if possible.    Continued abstinence from smoking! Medications currently being used; Nicotine patch - '7mg'$  - 10 patches remain Plan is to stop patches when supply runs out.    Motivation to quit remains breathing.    Total time with patient call and documentation of interaction: 13 minutes.  F/U Phone call planned: 3-4 weeks.

## 2021-08-19 NOTE — Assessment & Plan Note (Signed)
Continued abstinence from smoking! Medications currently being used; Nicotine patch - '7mg'$  - 10 patches remain Plan is to stop patches when supply runs out.    Motivation to quit remains breathing.

## 2021-09-07 DIAGNOSIS — J101 Influenza due to other identified influenza virus with other respiratory manifestations: Secondary | ICD-10-CM | POA: Diagnosis not present

## 2021-09-12 ENCOUNTER — Telehealth: Payer: Self-pay | Admitting: Pharmacist

## 2021-09-12 NOTE — Telephone Encounter (Signed)
-----   Message from Leavy Cella, [place] sent at 08/19/2021 10:48 AM EDT ----- Regarding: Off patches - and remain quit Breathing stable?

## 2021-09-12 NOTE — Telephone Encounter (Signed)
Patient contacted for follow/up of tobacco cessation success!  Since last contact patient reports no smoking, completely OFF - completed nicotine patches. Additonally, his breathing is slightly better than last contact following increase in oxygen flow rate with the direction of Dr. Andria Frames.  He continues to cough - mostly in the AM and evening.   Medications currently being used;  None.   Continues to rates IMPORTANCE of quitting tobacco as high.  Continues to rate CONFIDENCE of staying quit from tobacco use as high.   Motivation to quit: breathing.   We reviewed how cessation from smoking could increase mucus production and cough for a period of time and then the cough may be less in a few weeks or months.   He was again congratulated on quitting smoking AND completely tapering off of NRT.    Total time with patient call and documentation of interaction: 14 minutes. Follow-up phone call planned: 2-3 months in the future to reassess success and support long-term cessation.

## 2021-09-12 NOTE — Telephone Encounter (Signed)
Noted and agree. 

## 2021-10-08 DIAGNOSIS — J101 Influenza due to other identified influenza virus with other respiratory manifestations: Secondary | ICD-10-CM | POA: Diagnosis not present

## 2021-11-08 DIAGNOSIS — J101 Influenza due to other identified influenza virus with other respiratory manifestations: Secondary | ICD-10-CM | POA: Diagnosis not present

## 2021-12-08 DIAGNOSIS — J101 Influenza due to other identified influenza virus with other respiratory manifestations: Secondary | ICD-10-CM | POA: Diagnosis not present

## 2021-12-22 ENCOUNTER — Telehealth: Payer: Self-pay | Admitting: Pharmacist

## 2021-12-22 DIAGNOSIS — J441 Chronic obstructive pulmonary disease with (acute) exacerbation: Secondary | ICD-10-CM

## 2021-12-22 MED ORDER — PREDNISONE 50 MG PO TABS: 50.0000 mg | ORAL_TABLET | Freq: Every day | ORAL | 0 refills | Status: DC

## 2021-12-22 MED ORDER — DOXYCYCLINE HYCLATE 100 MG PO TABS: 100.0000 mg | ORAL_TABLET | Freq: Two times a day (BID) | ORAL | 0 refills | Status: DC

## 2021-12-22 NOTE — Addendum Note (Signed)
Addended by: Zenia Resides on: 12/22/2021 04:46 PM   Modules accepted: Orders

## 2021-12-22 NOTE — Telephone Encounter (Signed)
Called:  Sx consistent with COPD exacerbation.  Last prednisone was 3 months ago.  Will treat with pred and doxy.

## 2021-12-22 NOTE — Telephone Encounter (Signed)
-----   Message from Leavy Cella, [place] sent at 09/12/2021  9:38 AM EDT ----- Regarding: QUIT And OFF Tx  09/12/2021

## 2021-12-22 NOTE — Telephone Encounter (Signed)
Patient contacted for follow/up of tobacco cessation attempt.   Since last contact patient reports continued abstinence from smoking since 09/12/2021 Patient was happy to be celebrating another birthday.  He reports that his breathing has been worse over the weekend with increase sputum production and cough.  He denied fever.  We briefly discussed need for additional evaluation.  He was unsure if he needed to be seen and expressed some concern with taking additional prednisone.    Rate CONFIDENCE of staying quit from tobacco as high.   I shared that I would forward my note to Dr. Andria Frames.  Patient was open to the idea of Dr. Andria Frames following up with him by phone in the next 1-2 days to better determine respiratory status and management plan.  I encouraged him to contact us and be seen if breathing worsens.   Total time with patient call and documentation of interaction: 12 minutes. Follow-up phone call planned: 11  As patient has quit for 3 months - next tobacco cessation follow-up planned in 3 additional months.

## 2022-01-08 DIAGNOSIS — J101 Influenza due to other identified influenza virus with other respiratory manifestations: Secondary | ICD-10-CM | POA: Diagnosis not present

## 2022-02-07 DIAGNOSIS — J101 Influenza due to other identified influenza virus with other respiratory manifestations: Secondary | ICD-10-CM | POA: Diagnosis not present

## 2022-03-10 DIAGNOSIS — J101 Influenza due to other identified influenza virus with other respiratory manifestations: Secondary | ICD-10-CM | POA: Diagnosis not present

## 2022-03-16 ENCOUNTER — Telehealth: Payer: Self-pay | Admitting: Pharmacist

## 2022-03-16 DIAGNOSIS — R0602 Shortness of breath: Secondary | ICD-10-CM

## 2022-03-16 MED ORDER — TRELEGY ELLIPTA 200-62.5-25 MCG/ACT IN AEPB: 1.0000 | INHALATION_SPRAY | Freq: Every day | RESPIRATORY_TRACT | 6 refills | Status: DC

## 2022-03-16 MED ORDER — ALBUTEROL SULFATE HFA 108 (90 BASE) MCG/ACT IN AERS: INHALATION_SPRAY | RESPIRATORY_TRACT | 6 refills | Status: DC

## 2022-03-16 NOTE — Telephone Encounter (Signed)
Patient contacted for follow/up of tobacco cessation.  Increased dyspnea - reported by patient.  Conversational and breathing while speaking on phone seemed similar to previous calls.   Since last contact patient reports continued abstinence.  He reports his breathing has been "a little worse lately with increased dyspnea".   He reports taking his Trelegy daily AND taking his albuterol 2 puffs 4-5 times per day.   States that he has a O2 sat monitor which reads in the low 90s typically, however he does not have batteries currently.  Encouraged to buy new batteries and let us know at any point in the future if he observes readings of 88 or less.   Medications currently being used - NONE Continues to report IMPORTANCE of remaining tobacco free as high.   Most common triggers to thinking about tobacco use include; after meals OR at bedtime.     Motivation to quit: Lungs/breathing.   Denies any lung specialist at this time.  Treatments to consider in future: possibly through pulmonology.  Intermittent Antibiotic therapy (azithromycin - 3x weekly) Roflumilast (brand name daliresp)  And Oxygen therapy.    Requested refills of both Trelegy AND albuterol.  He did not desire a new appointment with Dr. Andria Frames at this time.  He is aware, that Dr. Andria Frames is retiring in the near future.  He welcomes a follow-up call if Dr. Andria Frames has time.   New prescriptions for both Trelegy and Albuterol MDI provided.   Total time with patient call and documentation of interaction: 12 minutes. Follow-up phone call planned: 2-3 months

## 2022-03-16 NOTE — Telephone Encounter (Signed)
Noted and agree. 

## 2022-04-10 DIAGNOSIS — J101 Influenza due to other identified influenza virus with other respiratory manifestations: Secondary | ICD-10-CM | POA: Diagnosis not present

## 2022-04-14 ENCOUNTER — Encounter: Payer: Self-pay | Admitting: Family Medicine

## 2022-04-21 ENCOUNTER — Telehealth: Payer: Self-pay | Admitting: *Deleted

## 2022-04-21 NOTE — Telephone Encounter (Signed)
Daughter calls and states that pharmacy sent her a letter stating that Medicaid will no longer cover albuterol and that it gave an alternate but she did not have the letter with her at the moment.   Contacted pharmacy and they reason they gave was that he picked up one on 04/08/22 so insurance would not pay again until 04/30/22.    Informed daughter who is agreeable to waiting until refill is due again.  FYI to PCP that med may need changed at that time. Christen Bame, CMA

## 2022-04-21 NOTE — Telephone Encounter (Signed)
Noted and agree. 

## 2022-05-09 DIAGNOSIS — J101 Influenza due to other identified influenza virus with other respiratory manifestations: Secondary | ICD-10-CM | POA: Diagnosis not present

## 2022-05-21 ENCOUNTER — Telehealth: Payer: Self-pay

## 2022-05-21 NOTE — Telephone Encounter (Signed)
Daughter calls nurse line in regards to home oxygen.   She reports her father has been paying out of pocket with his [employer]. She reports he recently was approved for Medicaid and is wondering if Medicaid would cover the cost.   I called [employer] and spoke with a representative. She reports his [employer] is paying for his oxygen, however he has a copay of 31 dollars a month. Rep reports she "believes" Medicaid should pick up "some" "if not all" of copay, however is unsure. She advised daughter call the local office for more information.   Attempted to call daughter to discuss further. However, no answer or option for VM.

## 2022-06-08 ENCOUNTER — Telehealth: Payer: Self-pay

## 2022-06-08 NOTE — Telephone Encounter (Signed)
Patient's daughter LVM on nurse line regarding concerns with patient's breathing.   Returned call to daughter. Daughter reports that patient has been having concerns with breathing.   Current medications are not working. Was previously on steroid. Last prescribed on 12/22/21.  Currently using albuterol inhaler, Trelegy daily, and nebulizers as needed.   He has been having issues with his breathing since Thursday-Friday of last week. Patient is constantly on 4 L of oxygen. Noted patient to have cough during conversation with daughter. He states that he has had this cough for the last several days. No known fever. Sp02: 94%  Patient feels that Trelegy is not working as well, feels that it is "wearing off" throughout the day.   Advised of ED precautions for episodes of difficulty breathing. Daughter requesting to schedule follow up for this week. Offered appointment for tomorrow, however, daughter unable to bring patient due to husband having jury duty. Scheduled with Dr. Jinny Sanders on Wednesday 4/3.  Daughter is asking if there are any additional medications that could be prescribed. (States that steroids have helped him in the past)  Advised that I would forward message to PCP and Dr. Valentina Lucks.   Reiterated ED precautions.

## 2022-06-09 DIAGNOSIS — J101 Influenza due to other identified influenza virus with other respiratory manifestations: Secondary | ICD-10-CM | POA: Diagnosis not present

## 2022-06-09 NOTE — Telephone Encounter (Signed)
Called daughter to check in on patient. She reports that he is doing about the same. No worsening of symptoms.   Provided with strict ED precautions. They will keep follow up appointment for tomorrow.   Talbot Grumbling, RN

## 2022-06-09 NOTE — Progress Notes (Unsigned)
    SUBJECTIVE:   CHIEF COMPLAINT / HPI: Breathing issues  Per phone note 06/08/22: Daughter reports that patient has been having concerns with breathing.  Current medications are not working. Was previously on steroid. Last prescribed on 12/22/21. Currently using albuterol inhaler, Trelegy daily, and nebulizers as needed.  He has been having issues with his breathing since Thursday-Friday of last week. Patient is constantly on 4 L of oxygen. Noted patient to have cough during conversation with daughter. He states that he has had this cough for the last several days. No known fever. Sp02: 94% Patient feels that Trelegy is not working as well, feels that it is "wearing off" throughout the day.     PERTINENT  PMH / PSH: COPD on chronic oxygen  OBJECTIVE:   There were no vitals taken for this visit.  ***  ASSESSMENT/PLAN:   No problem-specific Assessment & Plan notes found for this encounter.     Gerrit Heck, MD [place]

## 2022-06-10 ENCOUNTER — Ambulatory Visit
Admission: RE | Admit: 2022-06-10 | Discharge: 2022-06-10 | Disposition: A | Discharge status: Home / Self Care | Payer: BC Managed Care – PPO | Source: Ambulatory Visit | Attending: Family Medicine | Admitting: Family Medicine

## 2022-06-10 ENCOUNTER — Encounter: Payer: Self-pay | Admitting: Student

## 2022-06-10 ENCOUNTER — Ambulatory Visit (INDEPENDENT_AMBULATORY_CARE_PROVIDER_SITE_OTHER): Payer: BC Managed Care – PPO | Admitting: Student

## 2022-06-10 DIAGNOSIS — J449 Chronic obstructive pulmonary disease, unspecified: Secondary | ICD-10-CM | POA: Diagnosis not present

## 2022-06-10 DIAGNOSIS — R059 Cough, unspecified: Secondary | ICD-10-CM | POA: Diagnosis not present

## 2022-06-10 DIAGNOSIS — J441 Chronic obstructive pulmonary disease with (acute) exacerbation: Secondary | ICD-10-CM

## 2022-06-10 MED ORDER — ALBUTEROL SULFATE (2.5 MG/3ML) 0.083% IN NEBU
2.5000 mg | INHALATION_SOLUTION | Freq: Once | RESPIRATORY_TRACT | Status: AC
Start: 2022-06-10 — End: 2022-06-10
  Administered 2022-06-10: 2.5 mg via RESPIRATORY_TRACT

## 2022-06-10 MED ORDER — PREDNISONE 20 MG PO TABS
40.0000 mg | ORAL_TABLET | Freq: Every day | ORAL | 0 refills | Status: AC
Start: 2022-06-10 — End: 2022-06-14

## 2022-06-10 MED ORDER — METHYLPREDNISOLONE SODIUM SUCC 125 MG IJ SOLR
125.0000 mg | Freq: Once | INTRAMUSCULAR | Status: DC
Start: 2022-06-10 — End: 2022-09-22

## 2022-06-10 MED ORDER — METHYLPREDNISOLONE SODIUM SUCC 125 MG IJ SOLR: 125.0000 mg | Freq: Once | INTRAMUSCULAR | Status: DC

## 2022-06-10 MED ORDER — LEVOFLOXACIN 750 MG PO TABS
750.0000 mg | ORAL_TABLET | Freq: Every day | ORAL | 0 refills | Status: AC
Start: 2022-06-10 — End: 2022-06-15

## 2022-06-10 MED ORDER — IPRATROPIUM-ALBUTEROL 0.5-2.5 (3) MG/3ML IN SOLN: 3.0000 mL | Freq: Once | RESPIRATORY_TRACT | Status: DC

## 2022-06-10 MED ORDER — IPRATROPIUM-ALBUTEROL 0.5-2.5 (3) MG/3ML IN SOLN
3.0000 mL | RESPIRATORY_TRACT | 12 refills | Status: DC | PRN
Start: 2022-06-10 — End: 2023-06-07

## 2022-06-10 MED ORDER — IPRATROPIUM BROMIDE 0.02 % IN SOLN
0.5000 mg | Freq: Once | RESPIRATORY_TRACT | Status: AC
Start: 2022-06-10 — End: 2022-06-10
  Administered 2022-06-10: 0.5 mg via RESPIRATORY_TRACT

## 2022-06-10 NOTE — Assessment & Plan Note (Addendum)
Productive sputum and increased work of breathing.  After a DuoNeb and Solu-Medrol in office patient appears much more comfortable and has been saturating throughout the entire time well with ambulatory pulse ox in the high 90s. -Prednisone 40 mg for 4 more days -Levaquin 750 mg daily for 5 days (patient has recent hospitalization for COPD exacerbation in January) -DuoNeb nebulizer refilled for home use -2 view chest x-ray -Strict ED/return precautions discussed -Aquaphor for barrier with to being discussed not placing this in nostrils, can consider low-dose steroid cream for contact dermatitis if tubing irritation continues

## 2022-06-10 NOTE — Patient Instructions (Addendum)
It was great to see you! Thank you for allowing me to participate in your care!   Our plans for today:  -I am to send in a prednisone course for you to complete a 5-day course -I did send in an antibiotic called Levaquin to take as well -aquaphor on face (not in nostrils) for irritation-if worsens we can try other things -I am going to order a chest x-ray that you can go to [facility] or [facility] to get today to rule out a pneumonia -Return to care if you are having increased trouble breathing, fevers, chest pain, oxygen lower than 88%  -come back in 1 week for follow up  Take care and seek immediate care sooner if you develop any concerns.  Gerrit Heck, MD

## 2022-06-15 ENCOUNTER — Telehealth: Payer: Self-pay | Admitting: Pharmacist

## 2022-06-15 DIAGNOSIS — F17200 Nicotine dependence, unspecified, uncomplicated: Secondary | ICD-10-CM

## 2022-06-15 NOTE — Assessment & Plan Note (Addendum)
Contacted patient for follow/up of recent tobacco cessation  ~ 9 months ago. Since last contact patient reports complete abstinence from smoking.    More importantly, he reports improved breathing since being seen last week for COPD exacerbation.   Medications currently being used; NONE.   Patient continues to rate both IMPORTANCE and CONFIDENCE of remaining quit from tobacco as high.

## 2022-06-15 NOTE — Telephone Encounter (Signed)
Reviewed and agree with Dr Koval's plan.   

## 2022-06-15 NOTE — Telephone Encounter (Signed)
-----   Message from Kathrin Ruddy, RPH-CPP sent at 03/16/2022 10:45 AM EST ----- Regarding: Tobacco 9 month tobacco assessment - [place] [facility]

## 2022-06-15 NOTE — Telephone Encounter (Signed)
Contacted patient for follow/up of recent tobacco cessation  ~ 9 months ago. Since last contact patient reports complete abstinence from smoking.    More importantly, he reports improved breathing since being seen last week for COPD exacerbation.   Medications currently being used; NONE.   Patient continues to rate both IMPORTANCE and CONFIDENCE of remaining quit from tobacco as high.   Total time with patient call and documentation of interaction: 9 minutes. Follow-up phone call planned: 3 months (1 year quit)

## 2022-06-30 ENCOUNTER — Other Ambulatory Visit: Payer: Self-pay | Admitting: Student

## 2022-06-30 DIAGNOSIS — J449 Chronic obstructive pulmonary disease, unspecified: Secondary | ICD-10-CM

## 2022-07-09 DIAGNOSIS — J101 Influenza due to other identified influenza virus with other respiratory manifestations: Secondary | ICD-10-CM | POA: Diagnosis not present

## 2022-07-17 ENCOUNTER — Telehealth: Payer: Self-pay

## 2022-07-17 NOTE — Telephone Encounter (Signed)
Patient calls nurse line regarding albuterol inhaler prescription.   He reports that there is an issue with the insurance.   Called pharmacy. There is nothing needed on our end. Pharmacist states that she will call patient's insurance for next steps.   Called and provided patient with update. He will check back with pharmacy later this afternoon.   Veronda Prude, RN

## 2022-08-09 DIAGNOSIS — J101 Influenza due to other identified influenza virus with other respiratory manifestations: Secondary | ICD-10-CM | POA: Diagnosis not present

## 2022-09-08 DIAGNOSIS — J101 Influenza due to other identified influenza virus with other respiratory manifestations: Secondary | ICD-10-CM | POA: Diagnosis not present

## 2022-09-14 ENCOUNTER — Telehealth: Payer: Self-pay | Admitting: Pharmacist

## 2022-09-14 DIAGNOSIS — F17200 Nicotine dependence, unspecified, uncomplicated: Secondary | ICD-10-CM

## 2022-09-14 NOTE — Telephone Encounter (Signed)
-----   Message from Kathrin Ruddy, RPH-CPP sent at 06/15/2022  3:57 PM EDT ----- Regarding: 1 year quit

## 2022-09-14 NOTE — Assessment & Plan Note (Signed)
Patient contacted for follow/up of tobacco cessation.   Since last contact patient reports he has remained smoke free.  We celebrated his being quit for 1 year.   Continues to rates IMPORTANCE of staying quit from tobacco as high.  Continues to rate CONFIDENCE of quitting tobacco as high.   We discussed scheduling a visit with his newly assigned PCP, Dr. Rumball.  He agreed that he should plan to schedule one of these appointments in the near future.    Total time with patient call and documentation of interaction: 11 minutes. Follow-up phone call planned: No additional follow-up planned at this time as this patient has quit for 1 year.    I am happy to work with him again in the future if needed.      

## 2022-09-14 NOTE — Telephone Encounter (Signed)
Reviewed and agree with Dr Koval's plan.   

## 2022-09-14 NOTE — Telephone Encounter (Signed)
Patient contacted for follow/up of tobacco cessation.   Since last contact patient reports he has remained smoke free.  We celebrated his being quit for 1 year.   Continues to rates IMPORTANCE of staying quit from tobacco as high.  Continues to rate CONFIDENCE of quitting tobacco as high.   We discussed scheduling a visit with his newly assigned PCP, Dr. Linwood Dibbles.  He agreed that he should plan to schedule one of these appointments in the near future.    Total time with patient call and documentation of interaction: 11 minutes. Follow-up phone call planned: No additional follow-up planned at this time as this patient has quit for 1 year.    I am happy to work with him again in the future if needed.

## 2022-09-17 ENCOUNTER — Telehealth: Payer: Self-pay

## 2022-09-17 ENCOUNTER — Ambulatory Visit: Payer: BC Managed Care – PPO

## 2022-09-17 DIAGNOSIS — J3489 Other specified disorders of nose and nasal sinuses: Secondary | ICD-10-CM | POA: Diagnosis not present

## 2022-09-17 DIAGNOSIS — Z7951 Long term (current) use of inhaled steroids: Secondary | ICD-10-CM | POA: Diagnosis not present

## 2022-09-17 DIAGNOSIS — R5381 Other malaise: Secondary | ICD-10-CM | POA: Diagnosis not present

## 2022-09-17 DIAGNOSIS — R079 Chest pain, unspecified: Secondary | ICD-10-CM | POA: Diagnosis not present

## 2022-09-17 DIAGNOSIS — J441 Chronic obstructive pulmonary disease with (acute) exacerbation: Secondary | ICD-10-CM | POA: Diagnosis not present

## 2022-09-17 DIAGNOSIS — J029 Acute pharyngitis, unspecified: Secondary | ICD-10-CM | POA: Diagnosis not present

## 2022-09-17 DIAGNOSIS — R590 Localized enlarged lymph nodes: Secondary | ICD-10-CM | POA: Diagnosis not present

## 2022-09-17 DIAGNOSIS — Z1152 Encounter for screening for COVID-19: Secondary | ICD-10-CM | POA: Diagnosis not present

## 2022-09-17 DIAGNOSIS — F17210 Nicotine dependence, cigarettes, uncomplicated: Secondary | ICD-10-CM | POA: Diagnosis not present

## 2022-09-17 DIAGNOSIS — R0602 Shortness of breath: Secondary | ICD-10-CM | POA: Diagnosis not present

## 2022-09-17 DIAGNOSIS — R918 Other nonspecific abnormal finding of lung field: Secondary | ICD-10-CM | POA: Diagnosis not present

## 2022-09-17 DIAGNOSIS — Z882 Allergy status to sulfonamides status: Secondary | ICD-10-CM | POA: Diagnosis not present

## 2022-09-17 DIAGNOSIS — Z880 Allergy status to penicillin: Secondary | ICD-10-CM | POA: Diagnosis not present

## 2022-09-17 NOTE — Telephone Encounter (Signed)
Patient's daughter calls nurse line requesting refill on Prednisone. She reports that patient has been having trouble breathing since last night. His inhalers and neb treatments have not been helping.   Advised that patient would need appointment prior to prescription being sent in.   Scheduled appointment for this afternoon.   Daughter called back a few minutes later and advised that patient was going to go to the ED for further evaluation.   Requesting to go ahead and schedule follow up with PCP.   Scheduled with Dr. Linwood Dibbles next Wednesday at 9:50.  Veronda Prude, RN

## 2022-09-22 NOTE — Progress Notes (Unsigned)
   SUBJECTIVE:   CHIEF COMPLAINT / HPI:   COPD exacerbation - on albuterol and trelegy, duonebs prn. 4L O2 at home at baseline. - compliant with trelegy. Occasionally will have trouble getting albuterol out of inhaler, sometimes has to poke with pin. - went to [facility] ED 7/11 with SOB. Thought likely triggered by aspiration based on CTA. Also with some centrilobular nodules and lymphadenopathy, recommending f/u CT in 3 months. - received duonebs x3, hypertonic saline, solu-medrol. D/c with doxycycline x5 days, prednisone, hydroxyzine prn for anxiety during exacerbations. - breathing and coughing is better, not quite back to baseline.  - coughing not as productive. Was thick and yellow, now more clear. - today is last day of antibiotics. Finished steroids yesterday. - COPD typically flares worse with heat - no longer smoking, smoked 50 years, 3ppd.  OBJECTIVE:   BP 136/81   Pulse 80   Ht 5\' 7"  (1.702 m)   Wt 116 lb 3.2 oz (52.7 kg)   SpO2 99%   BMI 18.20 kg/m   Gen: chronically ill appearing, in NAD Card: RRR Lungs: slight end expiratory wheezing throughout. No crackles, rhonchi. Appropriate WOB on home 4L. Ext: WWP, no edema   ASSESSMENT/PLAN:   COPD with acute exacerbation (HCC) Improved, not quite back to baseline. Will extend steroids with taper. Continue trelegy, prn nebs and albuterol, home O2. Refills sent. DME placed for home O2. F/u if no better by next week.      Caro Laroche, DO

## 2022-09-23 ENCOUNTER — Ambulatory Visit: Payer: BC Managed Care – PPO | Admitting: Family Medicine

## 2022-09-23 ENCOUNTER — Encounter: Payer: Self-pay | Admitting: Family Medicine

## 2022-09-23 ENCOUNTER — Telehealth: Payer: Self-pay

## 2022-09-23 VITALS — BP 136/81 | HR 80 | Ht 67.0 in | Wt 116.2 lb

## 2022-09-23 DIAGNOSIS — J441 Chronic obstructive pulmonary disease with (acute) exacerbation: Secondary | ICD-10-CM

## 2022-09-23 DIAGNOSIS — Z87891 Personal history of nicotine dependence: Secondary | ICD-10-CM

## 2022-09-23 DIAGNOSIS — R0602 Shortness of breath: Secondary | ICD-10-CM

## 2022-09-23 DIAGNOSIS — J449 Chronic obstructive pulmonary disease, unspecified: Secondary | ICD-10-CM

## 2022-09-23 MED ORDER — PREDNISONE 10 MG (21) PO TBPK: ORAL_TABLET | ORAL | 0 refills | Status: DC

## 2022-09-23 MED ORDER — HYDROXYZINE HCL 25 MG PO TABS: 25.0000 mg | ORAL_TABLET | Freq: Three times a day (TID) | ORAL | 0 refills | Status: AC | PRN

## 2022-09-23 MED ORDER — ALBUTEROL SULFATE HFA 108 (90 BASE) MCG/ACT IN AERS
INHALATION_SPRAY | RESPIRATORY_TRACT | 6 refills | Status: DC
Start: 2022-09-23 — End: 2023-08-19

## 2022-09-23 NOTE — Assessment & Plan Note (Signed)
Improved, not quite back to baseline. Will extend steroids with taper. Continue trelegy, prn nebs and albuterol, home O2. Refills sent. DME placed for home O2. F/u if no better by next week.

## 2022-09-23 NOTE — Telephone Encounter (Signed)
Patients daughter calls nurse line requesting a prescription for Oxygen.   She reports he is losing [employer] at the end of July, however he will have Medicaid.  His oxygen company is requesting a new prescription for portable oxygen and home oxygen to bill under Medicaid.   Will forward to PCP to place order.     [facility]. [phone]

## 2022-09-23 NOTE — Patient Instructions (Addendum)
It was great to see you!  Our plans for today:  - We are sending in more steroids for you. - Take the hydroxyzine as needed for anxiety. - Let your pharmacy know about your albuterol inhaler trouble. - Come back if you are still having trouble by next week.   Take care and seek immediate care sooner if you develop any concerns.   Dr. Linwood Dibbles

## 2022-09-25 NOTE — Telephone Encounter (Signed)
Faxed order to [facility].   Veronda Prude, RN

## 2022-09-26 DIAGNOSIS — J441 Chronic obstructive pulmonary disease with (acute) exacerbation: Secondary | ICD-10-CM | POA: Diagnosis not present

## 2022-10-09 DIAGNOSIS — J101 Influenza due to other identified influenza virus with other respiratory manifestations: Secondary | ICD-10-CM | POA: Diagnosis not present

## 2022-11-09 DIAGNOSIS — J101 Influenza due to other identified influenza virus with other respiratory manifestations: Secondary | ICD-10-CM | POA: Diagnosis not present

## 2022-12-09 DIAGNOSIS — J101 Influenza due to other identified influenza virus with other respiratory manifestations: Secondary | ICD-10-CM | POA: Diagnosis not present

## 2023-01-09 DIAGNOSIS — J101 Influenza due to other identified influenza virus with other respiratory manifestations: Secondary | ICD-10-CM | POA: Diagnosis not present

## 2023-04-12 ENCOUNTER — Other Ambulatory Visit: Payer: Self-pay

## 2023-04-12 MED ORDER — TRELEGY ELLIPTA 200-62.5-25 MCG/ACT IN AEPB: 1.0000 | INHALATION_SPRAY | Freq: Every day | RESPIRATORY_TRACT | 6 refills | Status: AC

## 2023-04-16 ENCOUNTER — Encounter: Payer: Self-pay | Admitting: Family Medicine

## 2023-04-16 ENCOUNTER — Ambulatory Visit (INDEPENDENT_AMBULATORY_CARE_PROVIDER_SITE_OTHER): Payer: No Typology Code available for payment source | Admitting: Family Medicine

## 2023-04-16 VITALS — BP 130/80 | HR 109 | Ht 67.0 in | Wt 114.0 lb

## 2023-04-16 DIAGNOSIS — J441 Chronic obstructive pulmonary disease with (acute) exacerbation: Secondary | ICD-10-CM | POA: Diagnosis not present

## 2023-04-16 DIAGNOSIS — B029 Zoster without complications: Secondary | ICD-10-CM

## 2023-04-16 DIAGNOSIS — Z87891 Personal history of nicotine dependence: Secondary | ICD-10-CM | POA: Diagnosis not present

## 2023-04-16 DIAGNOSIS — Z23 Encounter for immunization: Secondary | ICD-10-CM

## 2023-04-16 MED ORDER — PREDNISONE 20 MG PO TABS: 40.0000 mg | ORAL_TABLET | Freq: Every day | ORAL | 0 refills | Status: AC

## 2023-04-16 MED ORDER — AZITHROMYCIN 250 MG PO TABS: ORAL_TABLET | ORAL | 0 refills | Status: DC

## 2023-04-16 NOTE — Assessment & Plan Note (Signed)
 Rx Prednisone  for COPD exacerbation. >1 week duration so antiviral likely not to have much effect at this point. Recommend shingles vaccine at pharmacy once healed.

## 2023-04-16 NOTE — Progress Notes (Signed)
   SUBJECTIVE:   CHIEF COMPLAINT / HPI:   Emphysema/COPD, severe - Medications: trelegy, albuterol  prn, duonebs prn. 4L O2 at home at baseline. - Compliance: good - no hospitalizations or ED visits in last 6 months. - currently with productive cough over the last few days. - doing albuterol  and nebs, just had treatment before coming for appointment.  - due for lung cancer screening.  - does not follow with Pulm, prefers not to.  Rash - 1 week duration, blistered. Itchy, slightly painful, red. Has been putting vaseline on it.   OBJECTIVE:   BP 130/80   Pulse (!) 109   Ht 5' 7 (1.702 m)   Wt 114 lb (51.7 kg)   SpO2 96%   BMI 17.85 kg/m   Gen: well appearing, in NAD Card: RRR on my exam Lungs: Comfortable WOB on home O2. Extensive wheezing throughout. Ext: WWP, no edema   ASSESSMENT/PLAN:   COPD with acute exacerbation (HCC) Severe, with current exacerbation. Rx prednisone , Zpack. Will likely need documentation of ambulatory pulse ox to demonstrate ongoing need for supplemental O2, instructed to return to clinic once over current exacerbation to perform.  Shingles Rx Prednisone  for COPD exacerbation. >1 week duration so antiviral likely not to have much effect at this point. Recommend shingles vaccine at pharmacy once healed.  Flu shot given today.   F/u 2 weeks.   Donald CHRISTELLA Lai, DO

## 2023-04-16 NOTE — Patient Instructions (Addendum)
 It was great to see you!  Our plans for today:  - Get your shingles vaccine at your pharmacy once your current shingles is healed. - We gave you your flu shot today. - Take the prednisone  and antibiotic for your COPD exacerbation. - We ordered a lung cancer screening CT for you today.  Take care and seek immediate care sooner if you develop any concerns.   Dr. Ayrianna Mcginniss

## 2023-04-16 NOTE — Assessment & Plan Note (Addendum)
 Severe, with current exacerbation. Rx prednisone , Zpack. Will likely need documentation of ambulatory pulse ox to demonstrate ongoing need for supplemental O2, instructed to return to clinic once over current exacerbation to perform.

## 2023-05-06 ENCOUNTER — Ambulatory Visit: Payer: No Typology Code available for payment source | Admitting: Family Medicine

## 2023-05-14 ENCOUNTER — Ambulatory Visit: Payer: No Typology Code available for payment source | Admitting: Family Medicine

## 2023-05-14 ENCOUNTER — Encounter: Payer: Self-pay | Admitting: Family Medicine

## 2023-05-14 VITALS — BP 133/90 | HR 107 | Wt 116.2 lb

## 2023-05-14 DIAGNOSIS — Z87891 Personal history of nicotine dependence: Secondary | ICD-10-CM | POA: Diagnosis not present

## 2023-05-14 DIAGNOSIS — J449 Chronic obstructive pulmonary disease, unspecified: Secondary | ICD-10-CM

## 2023-05-14 NOTE — Assessment & Plan Note (Signed)
 Severe. Amb pulse ox demonstrates ongoing need for supplemental oxygen. Continue current therapies.

## 2023-05-14 NOTE — Progress Notes (Signed)
   SUBJECTIVE:   CHIEF COMPLAINT / HPI:   Emphysema/COPD, severe - Medications: trelegy, albuterol prn, duonebs prn. 4L O2 at home at baseline. - Compliance: good - no hospitalizations or ED visits in last 6 months. Did have acute exacerbation at last visit 2/7, has since improved.  - due for lung cancer screening, ordered at last appt but insurance did not cover as was in acute exacerbation, will reorder today. - does not follow with Pulm, prefers not to. - needs recertification for oxygen.   OBJECTIVE:   BP (!) 133/90   Pulse (!) 107   Wt 116 lb 3.2 oz (52.7 kg)   SpO2 99%   BMI 18.20 kg/m   Gen: well appearing, in NAD Card: RRR Lungs: Comfortable WOB on home supplemental O2. Wheezing throughout at baseline. No crackles. Ext: WWP, no edema    ASSESSMENT/PLAN:   Problem List Items Addressed This Visit       Respiratory   COPD (chronic obstructive pulmonary disease) (HCC)   Severe. Amb pulse ox demonstrates ongoing need for supplemental oxygen. Continue current therapies.       Relevant Orders   CT CHEST LUNG CA SCREEN LOW DOSE W/O CM     Other   History of tobacco use - Primary   Relevant Orders   CT CHEST LUNG CA SCREEN LOW DOSE W/O CM        Caro Laroche, DO

## 2023-05-14 NOTE — Patient Instructions (Signed)
 It was great to see you!  Our plans for today:  - We reordered your CT scan.  - We performed the testing to recertify your oxygen today.   Take care and seek immediate care sooner if you develop any concerns.   Dr. Linwood Dibbles

## 2023-05-14 NOTE — Progress Notes (Signed)
 At rest his oxygen saturation was 86% on room air and pulse was 106. While walking, his oxygen saturation ranges from 82-86% on room air and his pulse was 120.  Drusilla Kanner, CMA

## 2023-05-24 ENCOUNTER — Telehealth: Payer: Self-pay

## 2023-05-24 DIAGNOSIS — J449 Chronic obstructive pulmonary disease, unspecified: Secondary | ICD-10-CM

## 2023-05-24 NOTE — Telephone Encounter (Signed)
 Patient returns call to nurse line.   He would like [place] to send new oxygen order to [facility] in [place].   Will forward to provider to place new order for oxygen. Patient reports that he is on 4 L [facility] continuously.   Will then send to [employer].   Veronda Prude, RN

## 2023-05-24 NOTE — Telephone Encounter (Signed)
 Received message from Dr. Linwood Dibbles regarding oxygen re-certification.   Called [facility] in [place] (DME supplier patient has been receiving oxygen through), they report that they are not in network with patient's insurance.   Called patient and provided with update.   Patient is going to contact insurance company to determine what DME supplier is in network and then call us back with this information.   Advised that we could then send orders to insurance covered supplier.   Veronda Prude, RN

## 2023-05-25 NOTE — Addendum Note (Signed)
 Addended by: Caro Laroche on: 05/25/2023 08:48 AM   Modules accepted: Orders

## 2023-05-25 NOTE — Telephone Encounter (Signed)
 DME order placed. Let me know if he needs a concentrator and I will change order.

## 2023-05-28 NOTE — Telephone Encounter (Signed)
 Order faxed with demographics to [employer].   Veronda Prude, RN

## 2023-06-04 ENCOUNTER — Encounter: Payer: Self-pay | Admitting: Family Medicine

## 2023-06-05 ENCOUNTER — Other Ambulatory Visit: Payer: Self-pay | Admitting: Student

## 2023-06-05 DIAGNOSIS — J449 Chronic obstructive pulmonary disease, unspecified: Secondary | ICD-10-CM

## 2023-06-09 ENCOUNTER — Encounter: Payer: Self-pay | Admitting: Family Medicine

## 2023-06-16 ENCOUNTER — Ambulatory Visit
Admission: RE | Admit: 2023-06-16 | Discharge: 2023-06-16 | Disposition: A | Discharge status: Home / Self Care | Source: Ambulatory Visit | Attending: Family Medicine

## 2023-06-16 DIAGNOSIS — Z87891 Personal history of nicotine dependence: Secondary | ICD-10-CM

## 2023-06-16 DIAGNOSIS — J449 Chronic obstructive pulmonary disease, unspecified: Secondary | ICD-10-CM

## 2023-07-19 ENCOUNTER — Encounter: Payer: Self-pay | Admitting: Family Medicine

## 2023-08-19 ENCOUNTER — Other Ambulatory Visit: Payer: Self-pay | Admitting: Family Medicine

## 2023-08-19 DIAGNOSIS — R0602 Shortness of breath: Secondary | ICD-10-CM
